# Patient Record
Sex: Female | Born: 1965 | Race: White | Hispanic: No | Marital: Married | Smoking: Former smoker
Health system: Southern US, Community
[De-identification: ages and names within clinical notes are randomized; demographics above are authoritative.]

## PROBLEM LIST (undated history)

## (undated) DIAGNOSIS — R112 Nausea with vomiting, unspecified: Secondary | ICD-10-CM

## (undated) DIAGNOSIS — C50511 Malignant neoplasm of lower-outer quadrant of right female breast: Secondary | ICD-10-CM

## (undated) DIAGNOSIS — Z8679 Personal history of other diseases of the circulatory system: Secondary | ICD-10-CM

## (undated) DIAGNOSIS — I499 Cardiac arrhythmia, unspecified: Secondary | ICD-10-CM

## (undated) DIAGNOSIS — R55 Syncope and collapse: Secondary | ICD-10-CM

## (undated) DIAGNOSIS — C50919 Malignant neoplasm of unspecified site of unspecified female breast: Secondary | ICD-10-CM

## (undated) DIAGNOSIS — Z9889 Other specified postprocedural states: Secondary | ICD-10-CM

## (undated) DIAGNOSIS — K5900 Constipation, unspecified: Secondary | ICD-10-CM

## (undated) HISTORY — DX: Malignant neoplasm of lower-outer quadrant of right female breast: C50.511

## (undated) HISTORY — PX: BREAST BIOPSY: SHX20

## (undated) HISTORY — PX: WISDOM TOOTH EXTRACTION: SHX21

## (undated) HISTORY — DX: Syncope and collapse: R55

---

## 1987-07-29 HISTORY — PX: VAGINAL HYSTERECTOMY: SUR661

## 1988-07-28 HISTORY — PX: APPENDECTOMY: SHX54

## 2012-07-28 DIAGNOSIS — Z8679 Personal history of other diseases of the circulatory system: Secondary | ICD-10-CM

## 2012-07-28 HISTORY — DX: Personal history of other diseases of the circulatory system: Z86.79

## 2013-05-05 ENCOUNTER — Encounter: Payer: Self-pay | Admitting: *Deleted

## 2013-05-05 ENCOUNTER — Ambulatory Visit (INDEPENDENT_AMBULATORY_CARE_PROVIDER_SITE_OTHER): Payer: PRIVATE HEALTH INSURANCE | Admitting: Internal Medicine

## 2013-05-05 ENCOUNTER — Encounter: Payer: Self-pay | Admitting: Internal Medicine

## 2013-05-05 VITALS — BP 110/70 | HR 80 | Ht 65.0 in | Wt 140.0 lb

## 2013-05-05 DIAGNOSIS — R000 Tachycardia, unspecified: Secondary | ICD-10-CM

## 2013-05-05 DIAGNOSIS — R55 Syncope and collapse: Secondary | ICD-10-CM

## 2013-05-05 NOTE — Progress Notes (Signed)
HPI  Patient is a 47 yo who was referred for evaluation of syncope and palpitations  The patient has had about 4 episdoes of syncope over the past four years  Last one was in June   She was working out at [employer] on ellipitica  Had gone about 11 min.  Got dizzy  HR up  Passed out.  When she woke up her HR was in the 180s. Episode prior was in May  She had been moving things in yard  Claims she was staying hydrated.  Passed out.  She has been [place]  Had been walking on treadmill.  No problem  Only elliptical.   Not working out since last event.    Smokes 1ppd    No CP No Known Allergies  Current Outpatient Prescriptions  Medication Sig Dispense Refill  . Biotin 5000 MCG CAPS Take 5,000 mcg by mouth daily.      . Cholecalciferol (VITAMIN D3) 5000 UNIT/ML LIQD Take 0.25 mLs by mouth daily.      . Cyanocobalamin (VITAMIN B-12 PO) Take 2,500 mcg by mouth daily.      . Multiple Vitamins-Minerals (CENTRUM PO) Take 1 tablet by mouth.      . senna-docusate (SENOKOT-S) 8.6-50 MG per tablet Take 1 tablet by mouth daily.      . vitamin C (ASCORBIC ACID) 500 MG tablet Take 500 mg by mouth daily.      . vitamin E 400 UNIT capsule Take 400 Units by mouth daily.       No current facility-administered medications for this visit.    Past Medical History  Diagnosis Date  . Syncope     sudden onset    Past Surgical History  Procedure Laterality Date  . Abdominal hysterectomy  1989  . Appendectomy  1990    Family History  Problem Relation Age of Onset  . Hyperlipidemia Father   . Hypertension Father   . Kidney failure Maternal Grandmother   . Diabetes Maternal Grandfather   . Stroke Maternal Grandfather   . Lung cancer Paternal Grandmother   . Heart attack Paternal Grandfather   . Heart disease Paternal Grandfather     History   Social History  . Marital Status: Married    Spouse Name: N/A    Number of Children: N/A  . Years of Education: N/A   Occupational History  . Not on  file.   Social History Main Topics  . Smoking status: Current Every Day Smoker -- 1.00 packs/day  . Smokeless tobacco: Not on file  . Alcohol Use: Yes  . Drug Use: No  . Sexual Activity: Not on file   Other Topics Concern  . Not on file   Social History Narrative  . No narrative on file    Review of Systems:  All systems reviewed.  They are negative to the above problem except as previously stated.  Vital Signs: BP 114/70  Pulse 74  Ht 5\' 5"  (1.651 m)  Wt 141 lb 12.8 oz (64.32 kg)  BMI 23.6 kg/m2  Physical Exam Patient is in NAD HEENT:  Normocephalic, atraumatic. EOMI, PERRLA.  Neck: JVP is normal.  No bruits.  Lungs: clear to auscultation. No rales no wheezes.  Heart: Regular rate and rhythm. Normal S1, S2. No S3.   No significant murmurs. PMI not displaced.  Abdomen:  Supple, nontender. Normal bowel sounds. No masses. No hepatomegaly.  Extremities:   Good distal pulses throughout. No lower extremity edema.  Musculoskeletal :moving all extremities.  Neuro:   alert and oriented x3.  CN II-XII grossly intact.  EKG  SR 74 bpm  Nonspecific ST T wave changes    Assessment and Plan:  1.  SYncope  She is not othostatic on exam.  I am not convinced that it due to arrhythmia  But she has taken her pulse and it has been fast in past I would recomm an echo  i would also recomm a stress test to watch HR/BP response.  2.  TOb  Counselled on cessaton.

## 2013-05-05 NOTE — Patient Instructions (Signed)
Your physician has requested that you have an exercise tolerance test.the same day as Dr. Tenny Craw is in the office.  For further information please visit [email or web address] Please also follow instruction sheet, as given.  Your physician has requested that you have an echocardiogram. Echocardiography is a painless test that uses sound waves to create images of your heart. It provides your doctor with information about the size and shape of your heart and how well your heart's chambers and valves are working. This procedure takes approximately one hour. There are no restrictions for this procedure.  No changes were made today with your medications

## 2013-06-16 ENCOUNTER — Ambulatory Visit (INDEPENDENT_AMBULATORY_CARE_PROVIDER_SITE_OTHER): Payer: PRIVATE HEALTH INSURANCE | Admitting: Internal Medicine

## 2013-06-16 ENCOUNTER — Ambulatory Visit (HOSPITAL_COMMUNITY): Payer: PRIVATE HEALTH INSURANCE | Attending: Cardiovascular Disease

## 2013-06-16 ENCOUNTER — Encounter: Payer: Self-pay | Admitting: Cardiovascular Disease

## 2013-06-16 ENCOUNTER — Encounter (INDEPENDENT_AMBULATORY_CARE_PROVIDER_SITE_OTHER): Payer: Self-pay

## 2013-06-16 DIAGNOSIS — R000 Tachycardia, unspecified: Secondary | ICD-10-CM

## 2013-06-16 DIAGNOSIS — R55 Syncope and collapse: Secondary | ICD-10-CM

## 2013-06-16 DIAGNOSIS — R002 Palpitations: Secondary | ICD-10-CM | POA: Insufficient documentation

## 2013-06-16 DIAGNOSIS — I079 Rheumatic tricuspid valve disease, unspecified: Secondary | ICD-10-CM | POA: Insufficient documentation

## 2013-06-16 DIAGNOSIS — F17200 Nicotine dependence, unspecified, uncomplicated: Secondary | ICD-10-CM | POA: Insufficient documentation

## 2013-06-16 NOTE — Progress Notes (Signed)
Exercise Treadmill Test  Pre-Exercise Testing Evaluation Rhythm: normal sinus  Rate: 78 bpm     Test  Exercise Tolerance Test Ordering MD: Dietrich Pates, MD  Interpreting MD: Dietrich Pates, MD  Unique Test No: 1  Treadmill:  1  Indication for ETT: syncope  Contraindication to ETT: No   Stress Modality: exercise - treadmill  Cardiac Imaging Performed: non   Protocol: standard Bruce - maximal  Max BP:  153/86  Max MPHR (bpm):  174 85% MPR (bpm):  148  MPHR obtained (bpm):  166 % MPHR obtained:  95%  Reached 85% MPHR (min:sec):   Total Exercise Time (min-sec):  5:46  Workload in METS:  7 Borg Scale:   Reason ETT Terminated:  SOB    ST Segment Analysis At Rest: normal ST segments - no evidence of significant ST depression With Exercise: no evidence of significant ST depression  Other Information Arrhythmia:  No Angina during ETT:  absent (0) Quality of ETT:  diagnostic  ETT Interpretation:  Clinically negative, electrically negative for ischemia.  Note patient got very SOB during study and had to stop  Did not appear to be ischemia.    HR increased quickly during test  Poor exercise tolerance.   Recommendations: Try to increase endurance with walking ,exercise.

## 2013-06-16 NOTE — Progress Notes (Signed)
Echocardiogram performed.  

## 2013-07-15 ENCOUNTER — Telehealth: Payer: Self-pay | Admitting: *Deleted

## 2013-07-15 NOTE — Telephone Encounter (Signed)
Per Dr Clotilde Dieter had stress test 11/20 Very SOB and poor exercise tolerance Stress test though was normal Could she gett into pulm rehab?  Aware of dr Tenny Craw recommendation, she is unable to do at this time due to her work schedule. She will work on exercising at home.

## 2014-10-30 ENCOUNTER — Telehealth: Payer: Self-pay | Admitting: *Deleted

## 2014-10-30 NOTE — Telephone Encounter (Signed)
Left message for a return phone call to schedule for [facility]. Awaiting patient response.  

## 2014-11-01 ENCOUNTER — Encounter: Payer: Self-pay | Admitting: *Deleted

## 2014-11-01 ENCOUNTER — Telehealth: Payer: Self-pay | Admitting: *Deleted

## 2014-11-01 DIAGNOSIS — Z170 Estrogen receptor positive status [ER+]: Secondary | ICD-10-CM

## 2014-11-01 DIAGNOSIS — C50511 Malignant neoplasm of lower-outer quadrant of right female breast: Secondary | ICD-10-CM

## 2014-11-01 HISTORY — DX: Malignant neoplasm of lower-outer quadrant of right female breast: C50.511

## 2014-11-01 NOTE — Telephone Encounter (Signed)
Confirmed [facility] for 11/08/14 at 1200 .  Instructions and contact information given.

## 2014-11-08 ENCOUNTER — Other Ambulatory Visit: Payer: Self-pay | Admitting: *Deleted

## 2014-11-08 ENCOUNTER — Ambulatory Visit: Payer: PRIVATE HEALTH INSURANCE | Admitting: Physical Therapy

## 2014-11-08 ENCOUNTER — Other Ambulatory Visit (HOSPITAL_BASED_OUTPATIENT_CLINIC_OR_DEPARTMENT_OTHER): Payer: BLUE CROSS/BLUE SHIELD

## 2014-11-08 ENCOUNTER — Encounter: Payer: Self-pay | Admitting: Oncology

## 2014-11-08 ENCOUNTER — Ambulatory Visit
Admission: RE | Admit: 2014-11-08 | Discharge: 2014-11-08 | Disposition: A | Discharge status: Home / Self Care | Payer: PRIVATE HEALTH INSURANCE | Source: Ambulatory Visit | Attending: Radiation Oncology | Admitting: Radiation Oncology

## 2014-11-08 ENCOUNTER — Ambulatory Visit: Payer: BLUE CROSS/BLUE SHIELD

## 2014-11-08 ENCOUNTER — Encounter (INDEPENDENT_AMBULATORY_CARE_PROVIDER_SITE_OTHER): Payer: Self-pay

## 2014-11-08 ENCOUNTER — Ambulatory Visit (HOSPITAL_BASED_OUTPATIENT_CLINIC_OR_DEPARTMENT_OTHER): Payer: BLUE CROSS/BLUE SHIELD | Admitting: Oncology

## 2014-11-08 ENCOUNTER — Other Ambulatory Visit: Payer: PRIVATE HEALTH INSURANCE | Admitting: Oncology

## 2014-11-08 VITALS — BP 123/75 | HR 79 | Temp 98.3°F | Resp 18 | Ht 65.0 in | Wt 118.2 lb

## 2014-11-08 DIAGNOSIS — C50511 Malignant neoplasm of lower-outer quadrant of right female breast: Secondary | ICD-10-CM

## 2014-11-08 DIAGNOSIS — D0511 Intraductal carcinoma in situ of right breast: Secondary | ICD-10-CM

## 2014-11-08 DIAGNOSIS — R002 Palpitations: Secondary | ICD-10-CM | POA: Diagnosis not present

## 2014-11-08 DIAGNOSIS — Z720 Tobacco use: Secondary | ICD-10-CM

## 2014-11-08 LAB — CBC WITH DIFFERENTIAL/PLATELET
BASO%: 1.2 % (ref 0.0–2.0)
Basophils Absolute: 0.1 10*3/uL (ref 0.0–0.1)
EOS%: 3.4 % (ref 0.0–7.0)
Eosinophils Absolute: 0.2 10*3/uL (ref 0.0–0.5)
HCT: 39.9 % (ref 34.8–46.6)
HGB: 12.9 g/dL (ref 11.6–15.9)
LYMPH%: 36.2 % (ref 14.0–49.7)
MCH: 29.7 pg (ref 25.1–34.0)
MCHC: 32.2 g/dL (ref 31.5–36.0)
MCV: 91.9 fL (ref 79.5–101.0)
MONO#: 0.9 10*3/uL (ref 0.1–0.9)
MONO%: 12.7 % (ref 0.0–14.0)
NEUT#: 3.3 10*3/uL (ref 1.5–6.5)
NEUT%: 46.5 % (ref 38.4–76.8)
Platelets: 386 10*3/uL (ref 145–400)
RBC: 4.34 10*6/uL (ref 3.70–5.45)
RDW: 13.4 % (ref 11.2–14.5)
WBC: 7 10*3/uL (ref 3.9–10.3)
lymph#: 2.6 10*3/uL (ref 0.9–3.3)

## 2014-11-08 LAB — COMPREHENSIVE METABOLIC PANEL (CC13)
ALT: 10 U/L (ref 0–55)
AST: 13 U/L (ref 5–34)
Albumin: 4.2 g/dL (ref 3.5–5.0)
Alkaline Phosphatase: 61 U/L (ref 40–150)
Anion Gap: 10 mEq/L (ref 3–11)
BUN: 11.8 mg/dL (ref 7.0–26.0)
CHLORIDE: 108 meq/L (ref 98–109)
CO2: 25 meq/L (ref 22–29)
Calcium: 9.3 mg/dL (ref 8.4–10.4)
Creatinine: 0.9 mg/dL (ref 0.6–1.1)
EGFR: 81 mL/min/{1.73_m2} — ABNORMAL LOW (ref >90)
Glucose: 87 mg/dl (ref 70–140)
POTASSIUM: 4.5 meq/L (ref 3.5–5.1)
SODIUM: 144 meq/L (ref 136–145)
TOTAL PROTEIN: 7.1 g/dL (ref 6.4–8.3)
Total Bilirubin: 0.82 mg/dL (ref 0.20–1.20)

## 2014-11-08 MED ORDER — VARENICLINE TARTRATE 0.5 MG PO TABS: 0.5000 mg | ORAL_TABLET | Freq: Two times a day (BID) | ORAL | Status: DC

## 2014-11-08 NOTE — Progress Notes (Signed)
Radiation Oncology         [phone]) [phone] ________________________________  Initial outpatient Consultation  Name: Tricia Thomas MRN: [id number]  Date: 11/08/2014  DOB: 05-Jan-1966  CZ:YSAYT,KZSWFUX Weyman Croon, MD  Stark Klein, MD   REFERRING PHYSICIAN: Stark Klein, MD  DIAGNOSIS: Stage 0 Right Breast LOQ DCIS, ER+ / PR+, Low Grade    ICD-9-CM ICD-10-CM   1. Breast cancer of lower-outer quadrant of right female breast 174.5 C50.511     HISTORY OF PRESENT ILLNESS::Tricia Thomas is a 49 y.o. female who presented with micro calcifications in the lateral right breast, 2.2cm on mammography.  Stereotactic biopsy revealed low grade DCIS that is ER / PR +.  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 She smokes cigarettes  PREVIOUS RADIATION THERAPY: No  PAST MEDICAL HISTORY:  has a past medical history of Syncope and Breast cancer of lower-outer quadrant of right female breast (11/01/2014).    PAST SURGICAL HISTORY: Past Surgical History  Procedure Laterality Date  . Abdominal hysterectomy  1989  . Appendectomy  1990  . Wisdom tooth extraction      FAMILY HISTORY: family history includes Diabetes in her maternal grandfather; Heart attack in her paternal grandfather; Heart disease in her paternal grandfather; Hyperlipidemia in her father; Hypertension in her father; Kidney failure in her maternal grandmother; Lung cancer in her paternal grandmother; Stroke in her maternal grandfather.  SOCIAL HISTORY:  reports that she has been smoking.  She does not have any smokeless tobacco history on file. She reports that she drinks alcohol. She reports that she does not use illicit drugs.  ALLERGIES: Review of patient's allergies indicates no known allergies.  MEDICATIONS:  No current outpatient prescriptions on file.   No current facility-administered medications for this encounter.    REVIEW OF SYSTEMS:  Notable for that above.   PHYSICAL EXAM:   Vitals with Age-Percentiles  11/08/2014  Length 323.5 cm  Systolic 573  Diastolic 78  Pulse 79  Respiration 18  Weight 53.615 kg  BMI 19.7  VISIT REPORT    General: Alert and oriented, in no acute distress HEENT: Head is normocephalic. Extraocular movements are intact. Oropharynx is clear. Neck: Neck is supple, no palpable cervical or supraclavicular lymphadenopathy. Heart: Regular in rate and rhythm with no murmurs, rubs, or gallops. Chest: Clear to auscultation bilaterally, with no rhonchi, wheezes, or rales. Abdomen: Soft, nontender, nondistended, with no rigidity or guarding. Extremities: No cyanosis or edema. Lymphatics: see Neck Exam Skin: No concerning lesions. Musculoskeletal: symmetric strength and muscle tone throughout. Neurologic: Cranial nerves II through XII are grossly intact. No obvious focalities. Speech is fluent. Coordination is intact. Psychiatric: Judgment and insight are intact. Affect is appropriate. Breasts: right breast LOQ biopsy changes; no axillary adenopathy bilaterally nor left breast lumps  ECOG = 0  0 - Asymptomatic (Fully active, able to carry on all predisease activities without restriction)  1 - Symptomatic but completely ambulatory (Restricted in physically strenuous activity but ambulatory and able to carry out work of a light or sedentary nature. For example, light housework, office work)  2 - Symptomatic, <50% in bed during the day (Ambulatory and capable of all self care but unable to carry out any work activities. Up and about more than 50% of waking hours)  3 - Symptomatic, >50% in bed, but not bedbound (Capable of only limited self-care, confined to bed or chair 50% or more of waking hours)  4 - Bedbound (Completely disabled. Cannot carry on any self-care.  Totally confined to bed or chair)  5 - Death   Eustace Pen MM, Creech RH, Tormey DC, et al. [phone]). "Toxicity and response criteria of the [facility]". [place]ike Oncol. 5 (6):  [phone]   LABORATORY DATA:  Lab Results  Component Value Date   WBC 7.0 11/08/2014   HGB 12.9 11/08/2014   HCT 39.9 11/08/2014   MCV 91.9 11/08/2014   PLT 386 11/08/2014   CMP     Component Value Date/Time   NA 144 11/08/2014 1220   K 4.5 11/08/2014 1220   CO2 25 11/08/2014 1220   GLUCOSE 87 11/08/2014 1220   BUN 11.8 11/08/2014 1220   CREATININE 0.9 11/08/2014 1220   CALCIUM 9.3 11/08/2014 1220   PROT 7.1 11/08/2014 1220   ALBUMIN 4.2 11/08/2014 1220   AST 13 11/08/2014 1220   ALT 10 11/08/2014 1220   ALKPHOS 61 11/08/2014 1220   BILITOT 0.82 11/08/2014 1220         RADIOGRAPHY: as above    IMPRESSION/PLAN: She has been discussed at our multidisciplinary tumor board.  The consensus is that she would be a good candidate for breast conservation. I talked to her about the option of a mastectomy and informed her that her expected overall survival would be equivalent between mastectomy and breast conservation, based upon randomized controlled data.    It was a pleasure meeting the patient today. We discussed the risks, benefits, and side effects of radiotherapy in the setting of breast conservation. We discussed that radiation would take approximately 4-6 weeks to complete and that I would give the patient a few weeks to heal following surgery before starting treatment planning. We spoke about acute effects including skin irritation and fatigue as well as much less common late effects including lung irritation. We spoke about the latest technology that is used to minimize the risk of late effects for breast cancer patients undergoing radiotherapy. No guarantees of treatment were given. The patient is enthusiastic about proceeding with treatment. I look forward to participating in the patient's care.    The patient continues to use tobacco. The patient was counseled to stop using tobacco and was offered pharmacotherapy to help with this. The patient declined pharmacotherapy in the  form of nicotine patches but may talk to her PCP about Chantix. __________________________________________   Eppie Gibson, MD

## 2014-11-08 NOTE — Progress Notes (Signed)
Checked in new pt with no financial concerns prior to seeing the dr. Informed pt if chemo is part of her treatment we will call her ins to see if Josem Kaufmann is req and will obtain it if it is as well as contact foundations that offer copay assistance for chemo if needed. She has my card for any billing questions or concerns.

## 2014-11-08 NOTE — Progress Notes (Signed)
[facility]  [phone] [phone]     ID: Tricia Thomas DOB: [birth year]  MR#: [id number]  CVE#:[id number]  Patient Care Team: Fay Records, MD as PCP - General (Cardiology) Stark Klein, MD as Consulting Physician (General Surgery) Chauncey Cruel, MD as Consulting Physician (Oncology) Eppie Gibson, MD as Attending Physician (Radiation Oncology) Mauro Kaufmann, RN as Registered Nurse Rockwell Germany, RN as Registered Nurse PCP: Dorris Carnes, MD GYN: OTHER MD:  CHIEF COMPLAINT: Ductal carcinoma in situ  CURRENT TREATMENT: Awaiting definitive surgery   BREAST CANCER HISTORY: Tricia Thomas had her first ever mammography at [facility] 09/03/2013. The breast composition was category C. Heterogeneous calcifications were noted in the left breast and she was recalled for additional views 09/08/2013. Grouped and scattered round calcifications in the left breast posteriorly were felt to be most likely benign, but 6 month follow-up was recommended. This was performed 03/20/2014. This time there was no mammographic evidence of malignancy.  On 10/24/2014 however when the patient had her annual follow-up mammography, in addition to the benign appearing calcifications previously noted, there was a 2.2 cm area of calcifications in the right breast now, upper outer quadrant, which appeared to have increased in number. Biopsy of this area 10/26/2014 showed ([id number]) ductal carcinoma in situ, grade 1, estrogen receptor 100% positive and progesterone receptor 100% positive, both with strong staining intensity.  Her subsequent history is as detailed below   INTERVAL HISTORY: Tricia Thomas was evaluated in the multidisciplinary breast cancer clinic 11/08/2014 accompanied by her husband Evelena Peat. Her case was presented that same day at the multidisciplinary breast cancer conference. At that time a preliminary plan was proposed for breast conserving surgery with no sentinel lymph node  sampling, followed by radiation and then hormones. Genetics were also recommended.  REVIEW OF SYSTEMS: There were no specific symptoms leading to the original mammogram, which was routinely scheduled. The patient denies unusual headaches, visual changes, nausea, vomiting, stiff neck, dizziness, or gait imbalance. There has been no cough, phlegm production, or pleurisy, no chest pain or pressure, and no change in bowel or bladder habits. She does have palpitation sometimes. These have been evaluated by cardiology. She has been advised to avoid caffeine. The patient denies fever, rash, bleeding, unexplained fatigue or unexplained weight loss. She admits to some seasonal sinus problems involving a little bit of cough, worries about forgetfulness and her weight, but denies anxiety or depression. A detailed review of systems was otherwise entirely negative.  PAST MEDICAL HISTORY: Past Medical History  Diagnosis Date  . Syncope     sudden onset  . Breast cancer of lower-outer quadrant of right female breast 11/01/2014    PAST SURGICAL HISTORY: Past Surgical History  Procedure Laterality Date  . Abdominal hysterectomy  1989  . Appendectomy  1990  . Wisdom tooth extraction      FAMILY HISTORY Family History  Problem Relation Age of Onset  . Hyperlipidemia Father   . Hypertension Father   . Kidney failure Maternal Grandmother   . Diabetes Maternal Grandfather   . Stroke Maternal Grandfather   . Lung cancer Paternal Grandmother   . Heart attack Paternal Grandfather   . Heart disease Paternal Grandfather    the patient's parents are still living as of April 2016. The patient has one brother and one sister. There is no cancer in the immediate family. The paternal grandmother died of a "vascular cancer" at age 36. There is no history of breast or ovarian cancer in  the family that the patient is aware of  GYNECOLOGIC HISTORY:  No LMP recorded. Menarche age 49, first live birth age 49. She is GX  P2. She underwent a hysterectomy without salpingo-oophorectomy in 1989. She did not take hormone replacement.  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 Her (second) husband, Jerri Glauser is Clinical cytogeneticist store in [place] with the patient lives. Prevacid she works in [place]). The patient's children are [place] Lives in [place] and Works As a Marine scientist and [employer] Who Lives in [place] and Is a Dealer. The Patient Has 2 Grandchildren. She Is Not a Ambulance person.    ADVANCED DIRECTIVES: Not in place   HEALTH MAINTENANCE: History  Substance Use Topics  . Smoking status: Current Every Day Smoker -- 1.00 packs/day  . Smokeless tobacco: Not on file  . Alcohol Use: Yes     Colonoscopy:  PAP:  Bone density:  Lipid panel:  No Known Allergies  No current outpatient prescriptions on file.   No current facility-administered medications for this visit.    OBJECTIVE: Middle-aged white woman without appears younger than stated age 49 Vitals:   11/08/14 1344  BP: 123/75  Pulse:   Temp:   Resp:      Body mass index is 19.67 kg/(m^2).    ECOG FS:0 - Asymptomatic  Ocular: Sclerae unicteric, pupils equal, round and reactive to light Ear-nose-throat: Oropharynx clear, upper and lower braces in place Lymphatic: No cervical or supraclavicular adenopathy Lungs no rales or rhonchi, good excursion bilaterally Heart regular rate and rhythm, no murmur appreciated Abd soft, nontender, positive bowel sounds MSK no focal spinal tenderness, no joint edema Neuro: non-focal, well-oriented, appropriate affect Breasts: The right breast is status post recent biopsy. I do not palpate a mass. There are no skin or nipple changes of concern. The right axilla is benign. The left breast is unremarkable   LAB RESULTS:  CMP     Component Value Date/Time   NA 144 11/08/2014 1220   K 4.5 11/08/2014 1220   CO2 25 11/08/2014  1220   GLUCOSE 87 11/08/2014 1220   BUN 11.8 11/08/2014 1220   CREATININE 0.9 11/08/2014 1220   CALCIUM 9.3 11/08/2014 1220   PROT 7.1 11/08/2014 1220   ALBUMIN 4.2 11/08/2014 1220   AST 13 11/08/2014 1220   ALT 10 11/08/2014 1220   ALKPHOS 61 11/08/2014 1220   BILITOT 0.82 11/08/2014 1220    INo results found for: SPEP, UPEP  Lab Results  Component Value Date   WBC 7.0 11/08/2014   NEUTROABS 3.3 11/08/2014   HGB 12.9 11/08/2014   HCT 39.9 11/08/2014   MCV 91.9 11/08/2014   PLT 386 11/08/2014      Chemistry      Component Value Date/Time   NA 144 11/08/2014 1220   K 4.5 11/08/2014 1220   CO2 25 11/08/2014 1220   BUN 11.8 11/08/2014 1220   CREATININE 0.9 11/08/2014 1220      Component Value Date/Time   CALCIUM 9.3 11/08/2014 1220   ALKPHOS 61 11/08/2014 1220   AST 13 11/08/2014 1220   ALT 10 11/08/2014 1220   BILITOT 0.82 11/08/2014 1220       No results found for: LABCA2  No components found for: LABCA125  No results for input(s): INR in the last 168 hours.  Urinalysis No results found for: COLORURINE, APPEARANCEUR, [place], [place], [place], [place], [place], [place], [place], [place], NITRITE, LEUKOCYTESUR  STUDIES: No results found.  ASSESSMENT: 49 y.o. [place] woman status post right breast biopsy 10/26/2014 for ductal carcinoma in situ, grade 1, strongly estrogen and progesterone receptor positive  (1) the patient is leaning towards mastectomy with reconstruction but has not yet decided on a definite course  (2) if she decides on lumpectomy she will need adjuvant radiation  (3) when she completes her local treatment she can consider anti-estrogens for breast cancer risk reduction  (4) Continuing tobacco abuse: The patient has been strongly advised to quit. She has requested Chantix, which she took before without complications. This will be prescribed for her.  PLAN: We spent the better part of today's hour-long  appointment discussing the biology of breast cancer in general, and the specifics of the patient's tumor in particular. Patchy understands noninvasive breast cancer in itself is not a life-threatening, since the cancer cells are trapped in the ducts were they started and they cannot travel to a vital organ. Our general recommendation is for lumpectomy with no sentinel lymph node sampling. This would be followed by radiation. If she took this course, her risk of local recurrence would be quite low and anti-estrogens would be of marginal benefit.   However she is attracted to the idea of mastectomy, since this "takes care of the problem", and allows her to avoid radiation. She could still take anti-estrogens for prophylaxis (risk of developing a cancer in the other breast would be approximately 1/2% per year).  Encourage her to look at photos of reconstructed breasts and to meet with plastic surgery before making a definite decision. This is being operationalized. I also urged her strongly to quit smoking for at least 2 months so she can heal properly from her surgery. She requested Chantix. She tells me that allowed her to quit smoking between January and December 2015 and that she had no problems from that medication. I have placed that prescription.  Finally she would prefer to be followed long-term by an oncologist in [place], since that is where she works. I suggested a couple of days but she tells me she already has a name in mind. However she would like to return to see me first and start an anti-estrogens before going to a Investment banker, corporate. I have made her a return appointment with me in approximately 2 months.  Tricia Thomas has a good understanding of the overall plan. She agrees with it. She knows the goal of treatment in her case is cure. She will call with any problems that may develop before her next visit here.  Chauncey Cruel, MD   11/08/2014 5:58 PM [facility] [address] [place], [place] [address] Tel. [phone]    Fax. [phone]

## 2014-11-09 ENCOUNTER — Telehealth: Payer: Self-pay | Admitting: Oncology

## 2014-11-09 ENCOUNTER — Other Ambulatory Visit: Payer: Self-pay | Admitting: *Deleted

## 2014-11-09 NOTE — Telephone Encounter (Signed)
per pof to sch pt appt-cld & gave pt appt time & date

## 2014-11-10 ENCOUNTER — Ambulatory Visit
Admission: RE | Admit: 2014-11-10 | Discharge: 2014-11-10 | Disposition: A | Discharge status: Home / Self Care | Payer: BLUE CROSS/BLUE SHIELD | Source: Ambulatory Visit | Attending: Oncology | Admitting: Oncology

## 2014-11-10 DIAGNOSIS — C50511 Malignant neoplasm of lower-outer quadrant of right female breast: Secondary | ICD-10-CM

## 2014-11-10 MED ORDER — GADOBENATE DIMEGLUMINE 529 MG/ML IV SOLN: 9.0000 mL | Freq: Once | INTRAVENOUS | Status: AC | PRN

## 2014-11-13 ENCOUNTER — Telehealth: Payer: Self-pay | Admitting: General Surgery

## 2014-11-13 ENCOUNTER — Other Ambulatory Visit: Payer: Self-pay

## 2014-11-13 ENCOUNTER — Other Ambulatory Visit: Payer: Self-pay | Admitting: General Surgery

## 2014-11-13 DIAGNOSIS — C50911 Malignant neoplasm of unspecified site of right female breast: Secondary | ICD-10-CM

## 2014-11-13 NOTE — Addendum Note (Signed)
Addended by: Stark Klein on: 11/13/2014 02:34 PM   Modules accepted: Orders

## 2014-11-14 ENCOUNTER — Telehealth: Payer: Self-pay | Admitting: *Deleted

## 2014-11-14 NOTE — Telephone Encounter (Signed)
I spoke to patient regarding her MRI.  She would like lumpectomy if possible.  I discussed this with Dr. Joanell Rising at [facility] radiology.  He will arrange for US guided possible LN bx on right and possible left sided biopsy.  If unable to see these lesions, we will arrange for MR guided biopsies.  I will see the patient after biopsies.  Pt in agreement with plan.

## 2014-11-14 NOTE — Telephone Encounter (Signed)
Left message for a return phone call from [facility] 11/08/14.  Awaiting patient response.

## 2014-11-16 ENCOUNTER — Other Ambulatory Visit: Payer: Self-pay | Admitting: Radiology

## 2014-11-20 ENCOUNTER — Other Ambulatory Visit: Payer: Self-pay | Admitting: Oncology

## 2014-11-21 ENCOUNTER — Other Ambulatory Visit: Payer: Self-pay

## 2014-11-21 ENCOUNTER — Other Ambulatory Visit: Payer: Self-pay | Admitting: General Surgery

## 2014-11-21 DIAGNOSIS — C50911 Malignant neoplasm of unspecified site of right female breast: Secondary | ICD-10-CM

## 2014-11-21 NOTE — Addendum Note (Signed)
Addended by: Stark Klein on: 11/21/2014 04:52 PM   Modules accepted: Orders

## 2014-11-22 ENCOUNTER — Other Ambulatory Visit: Payer: Self-pay | Admitting: General Surgery

## 2014-11-22 DIAGNOSIS — R928 Other abnormal and inconclusive findings on diagnostic imaging of breast: Secondary | ICD-10-CM

## 2014-11-23 ENCOUNTER — Other Ambulatory Visit: Payer: Self-pay | Admitting: General Surgery

## 2014-11-23 DIAGNOSIS — R928 Other abnormal and inconclusive findings on diagnostic imaging of breast: Secondary | ICD-10-CM

## 2014-11-24 ENCOUNTER — Other Ambulatory Visit: Payer: Self-pay | Admitting: General Surgery

## 2014-11-24 ENCOUNTER — Ambulatory Visit
Admission: RE | Admit: 2014-11-24 | Discharge: 2014-11-24 | Disposition: A | Discharge status: Home / Self Care | Payer: BLUE CROSS/BLUE SHIELD | Source: Ambulatory Visit | Attending: General Surgery | Admitting: General Surgery

## 2014-11-24 DIAGNOSIS — R928 Other abnormal and inconclusive findings on diagnostic imaging of breast: Secondary | ICD-10-CM

## 2014-11-24 MED ORDER — GADOBENATE DIMEGLUMINE 529 MG/ML IV SOLN: 10.0000 mL | Freq: Once | INTRAVENOUS | Status: AC | PRN

## 2014-11-27 ENCOUNTER — Ambulatory Visit
Admission: RE | Admit: 2014-11-27 | Discharge: 2014-11-27 | Disposition: A | Discharge status: Home / Self Care | Payer: BLUE CROSS/BLUE SHIELD | Source: Ambulatory Visit | Attending: General Surgery | Admitting: General Surgery

## 2014-11-27 DIAGNOSIS — R928 Other abnormal and inconclusive findings on diagnostic imaging of breast: Secondary | ICD-10-CM

## 2014-11-27 MED ORDER — GADOBENATE DIMEGLUMINE 529 MG/ML IV SOLN
9.0000 mL | Freq: Once | INTRAVENOUS | Status: AC | PRN
  Administered 2014-11-27: 9 mL via INTRAVENOUS

## 2014-12-08 ENCOUNTER — Other Ambulatory Visit: Payer: Self-pay | Admitting: General Surgery

## 2014-12-08 DIAGNOSIS — C50911 Malignant neoplasm of unspecified site of right female breast: Secondary | ICD-10-CM

## 2014-12-11 ENCOUNTER — Encounter: Payer: Self-pay | Admitting: Oncology

## 2014-12-15 ENCOUNTER — Telehealth: Payer: Self-pay | Admitting: Oncology

## 2014-12-15 ENCOUNTER — Encounter: Payer: Self-pay | Admitting: *Deleted

## 2014-12-15 NOTE — Telephone Encounter (Signed)
Left message to confirm appointment change from 06/06 to 07/25 per pof. Mailed calendar.

## 2015-01-01 ENCOUNTER — Ambulatory Visit: Payer: BLUE CROSS/BLUE SHIELD | Admitting: Oncology

## 2015-01-30 ENCOUNTER — Other Ambulatory Visit (HOSPITAL_COMMUNITY): Payer: Self-pay | Admitting: Plastic Surgery

## 2015-01-31 ENCOUNTER — Encounter (HOSPITAL_COMMUNITY): Payer: Self-pay

## 2015-01-31 ENCOUNTER — Encounter (HOSPITAL_COMMUNITY)
Admission: RE | Admit: 2015-01-31 | Discharge: 2015-01-31 | Disposition: A | Discharge status: Home / Self Care | Payer: BLUE CROSS/BLUE SHIELD | Source: Ambulatory Visit | Attending: General Surgery | Admitting: General Surgery

## 2015-01-31 DIAGNOSIS — C50919 Malignant neoplasm of unspecified site of unspecified female breast: Secondary | ICD-10-CM | POA: Insufficient documentation

## 2015-01-31 DIAGNOSIS — Z01812 Encounter for preprocedural laboratory examination: Secondary | ICD-10-CM | POA: Diagnosis present

## 2015-01-31 HISTORY — DX: Personal history of other diseases of the circulatory system: Z86.79

## 2015-01-31 HISTORY — DX: Constipation, unspecified: K59.00

## 2015-01-31 HISTORY — DX: Cardiac arrhythmia, unspecified: I49.9

## 2015-01-31 LAB — CBC
HCT: 36.9 % (ref 36.0–46.0)
HEMOGLOBIN: 12.4 g/dL (ref 12.0–15.0)
MCH: 30.6 pg (ref 26.0–34.0)
MCHC: 33.6 g/dL (ref 30.0–36.0)
MCV: 91.1 fL (ref 78.0–100.0)
Platelets: 264 10*3/uL (ref 150–400)
RBC: 4.05 MIL/uL (ref 3.87–5.11)
RDW: 12.9 % (ref 11.5–15.5)
WBC: 7.5 10*3/uL (ref 4.0–10.5)

## 2015-01-31 NOTE — Pre-Procedure Instructions (Signed)
    Tricia Thomas  01/31/2015       Your procedure is scheduled on Tuesday, July 12.  Report to [facility] Admitting at 9:30 A.M.  Call this number if you have problems the morning of surgery:  [phone]   Remember:  Do not eat food or drink liquids after midnight. Monday night  Take these medicines the morning of surgery with A SIP OF WATER: none   Do not wear jewelry, make-up or nail polish.  Do not wear lotions, powders, or perfumes.     Do not shave 48 hours prior to surgery.     Do not bring valuables to the hospital.  [facility] is not responsible for any belongings or valuables.  Contacts, dentures or bridgework may not be worn into surgery.  Leave your suitcase in the car.  After surgery it may be brought to your room.  For patients admitted to the hospital, discharge time will be determined by your treatment team.    Special instructions:  Preparing for surgery; instructions for CHG showers  Please read over the following fact sheets that you were given. Pain Booklet, Coughing and Deep Breathing and Surgical Site Infection Prevention

## 2015-02-05 MED ORDER — HEPARIN SODIUM (PORCINE) 5000 UNIT/ML IJ SOLN
5000.0000 [IU] | INTRAMUSCULAR | Status: AC
  Administered 2015-02-06: 5000 [IU] via SUBCUTANEOUS
  Filled 2015-02-05: qty 1

## 2015-02-05 MED ORDER — CEFAZOLIN SODIUM-DEXTROSE 2-3 GM-% IV SOLR
2.0000 g | INTRAVENOUS | Status: AC
  Administered 2015-02-06 (×2): 2 g via INTRAVENOUS
  Filled 2015-02-05: qty 50

## 2015-02-06 ENCOUNTER — Inpatient Hospital Stay (HOSPITAL_COMMUNITY)
Admission: RE | Admit: 2015-02-06 | Discharge: 2015-02-07 | DRG: 581 | Disposition: A | Discharge status: Home / Self Care | Payer: BLUE CROSS/BLUE SHIELD | Source: Ambulatory Visit | Attending: Plastic Surgery | Admitting: Plastic Surgery

## 2015-02-06 ENCOUNTER — Inpatient Hospital Stay (HOSPITAL_COMMUNITY): Payer: BLUE CROSS/BLUE SHIELD | Admitting: Anesthesiology

## 2015-02-06 ENCOUNTER — Ambulatory Visit (HOSPITAL_COMMUNITY)
Admission: RE | Admit: 2015-02-06 | Discharge: 2015-02-06 | Disposition: A | Discharge status: Home / Self Care | Payer: BLUE CROSS/BLUE SHIELD | Source: Ambulatory Visit | Attending: General Surgery | Admitting: General Surgery

## 2015-02-06 ENCOUNTER — Encounter (HOSPITAL_COMMUNITY): Payer: Self-pay | Admitting: General Surgery

## 2015-02-06 ENCOUNTER — Encounter (HOSPITAL_COMMUNITY)
Admission: RE | Disposition: A | Discharge status: Home / Self Care | Payer: Self-pay | Source: Ambulatory Visit | Attending: Plastic Surgery

## 2015-02-06 DIAGNOSIS — F17210 Nicotine dependence, cigarettes, uncomplicated: Secondary | ICD-10-CM | POA: Diagnosis present

## 2015-02-06 DIAGNOSIS — C50411 Malignant neoplasm of upper-outer quadrant of right female breast: Principal | ICD-10-CM | POA: Diagnosis present

## 2015-02-06 DIAGNOSIS — C50911 Malignant neoplasm of unspecified site of right female breast: Secondary | ICD-10-CM

## 2015-02-06 DIAGNOSIS — C50919 Malignant neoplasm of unspecified site of unspecified female breast: Secondary | ICD-10-CM | POA: Diagnosis present

## 2015-02-06 HISTORY — PX: NIPPLE SPARING MASTECTOMY/SENTINAL LYMPH NODE BIOPSY/RECONSTRUCTION/PLACEMENT OF TISSUE EXPANDER: SHX6484

## 2015-02-06 HISTORY — PX: RECONSTRUCTION BREAST IMMEDIATE / DELAYED W/ TISSUE EXPANDER: SUR1077

## 2015-02-06 HISTORY — PX: BREAST RECONSTRUCTION WITH PLACEMENT OF TISSUE EXPANDER AND FLEX HD (ACELLULAR HYDRATED DERMIS): SHX6295

## 2015-02-06 HISTORY — PX: MASTECTOMY COMPLETE / SIMPLE W/ SENTINEL NODE BIOPSY: SUR846

## 2015-02-06 SURGERY — NIPPLE SPARING MASTECTOMY WITH SENTINAL LYMPH NODE BIOPSY AND  RECONSTRUCTION WITH PLACEMENT OF TISSUE EXPANDER
Anesthesia: General | Site: Breast | Laterality: Right

## 2015-02-06 MED ORDER — ONDANSETRON HCL 4 MG/2ML IJ SOLN: 4.0000 mg | Freq: Once | INTRAMUSCULAR | Status: DC | PRN

## 2015-02-06 MED ORDER — DOCUSATE SODIUM 100 MG PO CAPS: 100.0000 mg | ORAL_CAPSULE | Freq: Every day | ORAL | Status: DC

## 2015-02-06 MED ORDER — DEXAMETHASONE SODIUM PHOSPHATE 4 MG/ML IJ SOLN
INTRAMUSCULAR | Status: AC
  Filled 2015-02-06: qty 1

## 2015-02-06 MED ORDER — LACTATED RINGERS IV SOLN
INTRAVENOUS | Status: DC
  Administered 2015-02-06: 10:00:00 via INTRAVENOUS

## 2015-02-06 MED ORDER — PNEUMOCOCCAL VAC POLYVALENT 25 MCG/0.5ML IJ INJ
0.5000 mL | INJECTION | INTRAMUSCULAR | Status: AC
  Administered 2015-02-07: 0.5 mL via INTRAMUSCULAR
  Filled 2015-02-06: qty 0.5

## 2015-02-06 MED ORDER — OXYCODONE HCL 5 MG PO TABS: 5.0000 mg | ORAL_TABLET | Freq: Once | ORAL | Status: DC | PRN

## 2015-02-06 MED ORDER — PHENYLEPHRINE HCL 10 MG/ML IJ SOLN
INTRAMUSCULAR | Status: DC | PRN
  Administered 2015-02-06: 80 ug via INTRAVENOUS
  Administered 2015-02-06: 40 ug via INTRAVENOUS
  Administered 2015-02-06: 80 ug via INTRAVENOUS
  Administered 2015-02-06: 40 ug via INTRAVENOUS

## 2015-02-06 MED ORDER — PHENYLEPHRINE 40 MCG/ML (10ML) SYRINGE FOR IV PUSH (FOR BLOOD PRESSURE SUPPORT)
PREFILLED_SYRINGE | INTRAVENOUS | Status: AC
  Filled 2015-02-06: qty 10

## 2015-02-06 MED ORDER — ONDANSETRON HCL 4 MG/2ML IJ SOLN
INTRAMUSCULAR | Status: AC
  Filled 2015-02-06: qty 2

## 2015-02-06 MED ORDER — CEFAZOLIN SODIUM 1-5 GM-% IV SOLN
1.0000 g | Freq: Four times a day (QID) | INTRAVENOUS | Status: DC
  Administered 2015-02-06 – 2015-02-07 (×3): 1 g via INTRAVENOUS
  Filled 2015-02-06 (×4): qty 50

## 2015-02-06 MED ORDER — ROCURONIUM BROMIDE 100 MG/10ML IV SOLN
INTRAVENOUS | Status: DC | PRN
  Administered 2015-02-06: 50 mg via INTRAVENOUS
  Administered 2015-02-06: 10 mg via INTRAVENOUS
  Administered 2015-02-06: 40 mg via INTRAVENOUS

## 2015-02-06 MED ORDER — DEXAMETHASONE SODIUM PHOSPHATE 4 MG/ML IJ SOLN
4.0000 mg | Freq: Once | INTRAMUSCULAR | Status: AC
  Administered 2015-02-06: 4 mg via INTRAVENOUS

## 2015-02-06 MED ORDER — HEPARIN SODIUM (PORCINE) 5000 UNIT/ML IJ SOLN
5000.0000 [IU] | Freq: Three times a day (TID) | INTRAMUSCULAR | Status: DC
  Administered 2015-02-07: 5000 [IU] via SUBCUTANEOUS
  Filled 2015-02-06: qty 1

## 2015-02-06 MED ORDER — FENTANYL CITRATE (PF) 100 MCG/2ML IJ SOLN
INTRAMUSCULAR | Status: AC
  Filled 2015-02-06: qty 2

## 2015-02-06 MED ORDER — FENTANYL CITRATE (PF) 100 MCG/2ML IJ SOLN
100.0000 ug | INTRAMUSCULAR | Status: DC | PRN
  Administered 2015-02-06: 100 ug via INTRAVENOUS

## 2015-02-06 MED ORDER — LACTATED RINGERS IV SOLN
INTRAVENOUS | Status: DC | PRN
  Administered 2015-02-06 (×2): via INTRAVENOUS

## 2015-02-06 MED ORDER — DEXAMETHASONE SODIUM PHOSPHATE 4 MG/ML IJ SOLN
INTRAMUSCULAR | Status: DC | PRN
  Administered 2015-02-06: 8 mg via INTRAVENOUS

## 2015-02-06 MED ORDER — FENTANYL CITRATE (PF) 250 MCG/5ML IJ SOLN
INTRAMUSCULAR | Status: AC
  Filled 2015-02-06: qty 5

## 2015-02-06 MED ORDER — LIDOCAINE HCL (CARDIAC) 20 MG/ML IV SOLN
INTRAVENOUS | Status: DC | PRN
  Administered 2015-02-06: 40 mg via INTRAVENOUS

## 2015-02-06 MED ORDER — NEOSTIGMINE METHYLSULFATE 10 MG/10ML IV SOLN
INTRAVENOUS | Status: DC | PRN
  Administered 2015-02-06: 4 mg via INTRAVENOUS

## 2015-02-06 MED ORDER — MIDAZOLAM HCL 2 MG/2ML IJ SOLN
INTRAMUSCULAR | Status: AC
  Filled 2015-02-06: qty 2

## 2015-02-06 MED ORDER — PROPOFOL 10 MG/ML IV BOLUS
INTRAVENOUS | Status: DC | PRN
  Administered 2015-02-06: 150 mg via INTRAVENOUS

## 2015-02-06 MED ORDER — OXYCODONE HCL 5 MG/5ML PO SOLN: 5.0000 mg | Freq: Once | ORAL | Status: DC | PRN

## 2015-02-06 MED ORDER — DEXTROSE-NACL 5-0.45 % IV SOLN
INTRAVENOUS | Status: DC
Start: 2015-02-06 — End: 2015-02-07
  Administered 2015-02-06 – 2015-02-07 (×2): via INTRAVENOUS

## 2015-02-06 MED ORDER — GLYCOPYRROLATE 0.2 MG/ML IJ SOLN
INTRAMUSCULAR | Status: DC | PRN
  Administered 2015-02-06: 0.6 mg via INTRAVENOUS

## 2015-02-06 MED ORDER — HYDROMORPHONE HCL 2 MG PO TABS
2.0000 mg | ORAL_TABLET | ORAL | Status: DC | PRN
Start: 2015-02-06 — End: 2015-02-07
  Administered 2015-02-07: 4 mg via ORAL
  Administered 2015-02-07: 2 mg via ORAL
  Filled 2015-02-06: qty 1
  Filled 2015-02-06: qty 2

## 2015-02-06 MED ORDER — PROMETHAZINE HCL 25 MG/ML IJ SOLN
6.2500 mg | INTRAMUSCULAR | Status: DC | PRN
  Administered 2015-02-06: 6.25 mg via INTRAVENOUS
  Filled 2015-02-06: qty 1

## 2015-02-06 MED ORDER — 0.9 % SODIUM CHLORIDE (POUR BTL) OPTIME
TOPICAL | Status: DC | PRN
  Administered 2015-02-06 (×4): 1000 mL

## 2015-02-06 MED ORDER — FENTANYL CITRATE (PF) 100 MCG/2ML IJ SOLN
INTRAMUSCULAR | Status: DC | PRN
  Administered 2015-02-06 (×3): 50 ug via INTRAVENOUS
  Administered 2015-02-06: 100 ug via INTRAVENOUS

## 2015-02-06 MED ORDER — CEFAZOLIN SODIUM 1 G IJ SOLR
Freq: Once | INTRAMUSCULAR | Status: AC
Start: 2015-02-06 — End: 2015-02-06
  Administered 2015-02-06: 1000 mL
  Filled 2015-02-06: qty 1

## 2015-02-06 MED ORDER — HYDROMORPHONE HCL 1 MG/ML IJ SOLN
0.5000 mg | INTRAMUSCULAR | Status: DC | PRN
  Administered 2015-02-06: 0.5 mg via INTRAVENOUS
  Filled 2015-02-06: qty 1

## 2015-02-06 MED ORDER — MIDAZOLAM HCL 5 MG/5ML IJ SOLN
INTRAMUSCULAR | Status: DC | PRN
  Administered 2015-02-06: 2 mg via INTRAVENOUS

## 2015-02-06 MED ORDER — VARENICLINE TARTRATE 0.5 MG PO TABS
0.5000 mg | ORAL_TABLET | Freq: Two times a day (BID) | ORAL | Status: DC
  Filled 2015-02-06 (×4): qty 1

## 2015-02-06 MED ORDER — PROPOFOL 10 MG/ML IV BOLUS
INTRAVENOUS | Status: AC
  Filled 2015-02-06: qty 20

## 2015-02-06 MED ORDER — ROCURONIUM BROMIDE 50 MG/5ML IV SOLN
INTRAVENOUS | Status: AC
  Filled 2015-02-06: qty 1

## 2015-02-06 MED ORDER — MIDAZOLAM HCL 2 MG/2ML IJ SOLN
2.0000 mg | INTRAMUSCULAR | Status: DC | PRN
  Administered 2015-02-06: 2 mg via INTRAVENOUS

## 2015-02-06 MED ORDER — ONDANSETRON HCL 4 MG/2ML IJ SOLN
INTRAMUSCULAR | Status: DC | PRN
  Administered 2015-02-06 (×2): 4 mg via INTRAVENOUS

## 2015-02-06 MED ORDER — LIDOCAINE HCL (CARDIAC) 20 MG/ML IV SOLN
INTRAVENOUS | Status: AC
  Filled 2015-02-06: qty 5

## 2015-02-06 MED ORDER — METHOCARBAMOL 500 MG PO TABS
500.0000 mg | ORAL_TABLET | Freq: Four times a day (QID) | ORAL | Status: DC
  Filled 2015-02-06 (×2): qty 1

## 2015-02-06 MED ORDER — METOCLOPRAMIDE HCL 10 MG/10ML PO SOLN: 10.0000 mg | Freq: Three times a day (TID) | ORAL | Status: DC

## 2015-02-06 MED ORDER — METOCLOPRAMIDE HCL 5 MG/ML IJ SOLN
INTRAMUSCULAR | Status: AC
  Administered 2015-02-06: 10 mg
  Filled 2015-02-06: qty 2

## 2015-02-06 MED ORDER — TECHNETIUM TC 99M SULFUR COLLOID FILTERED
1.0000 | Freq: Once | INTRAVENOUS | Status: AC | PRN
  Administered 2015-02-06: 1 via INTRADERMAL

## 2015-02-06 MED ORDER — FENTANYL CITRATE (PF) 100 MCG/2ML IJ SOLN: 25.0000 ug | INTRAMUSCULAR | Status: DC | PRN

## 2015-02-06 SURGICAL SUPPLY — 83 items
APPLIER CLIP 9.375 MED OPEN (MISCELLANEOUS)
ATCH SMKEVC FLXB CAUT HNDSWH (FILTER) ×1 IMPLANT
BAG DECANTER FOR FLEXI CONT (MISCELLANEOUS) ×6 IMPLANT
BINDER BREAST LRG (GAUZE/BANDAGES/DRESSINGS) IMPLANT
BINDER BREAST XLRG (GAUZE/BANDAGES/DRESSINGS) ×3 IMPLANT
BIOPATCH RED 1 DISK 7.0 (GAUZE/BANDAGES/DRESSINGS) ×4 IMPLANT
BIOPATCH RED 1IN DISK 7.0MM (GAUZE/BANDAGES/DRESSINGS) ×2
BLADE 10 SAFETY STRL DISP (BLADE) ×3 IMPLANT
BLADE SURG 15 STRL LF DISP TIS (BLADE) ×1 IMPLANT
BLADE SURG 15 STRL SS (BLADE) ×2
CANISTER SUCTION 2500CC (MISCELLANEOUS) ×9 IMPLANT
CHLORAPREP W/TINT 26ML (MISCELLANEOUS) ×6 IMPLANT
CLIP APPLIE 9.375 MED OPEN (MISCELLANEOUS) IMPLANT
CLIP TI MEDIUM 24 (CLIP) ×3 IMPLANT
CLIP TI MEDIUM 6 (CLIP) ×3 IMPLANT
CLIP TI WIDE RED SMALL 6 (CLIP) ×3 IMPLANT
CONT SPEC 4OZ CLIKSEAL STRL BL (MISCELLANEOUS) ×6 IMPLANT
COVER PROBE W GEL 5X96 (DRAPES) ×3 IMPLANT
COVER SURGICAL LIGHT HANDLE (MISCELLANEOUS) ×6 IMPLANT
DERMABOND ADVANCED (GAUZE/BANDAGES/DRESSINGS) ×2
DERMABOND ADVANCED .7 DNX12 (GAUZE/BANDAGES/DRESSINGS) ×1 IMPLANT
DEVICE DISSECT PLASMABLAD 3.0S (MISCELLANEOUS) ×1 IMPLANT
DRAIN CHANNEL 19F RND (DRAIN) ×6 IMPLANT
DRAPE ORTHO SPLIT 77X108 STRL (DRAPES) ×4
DRAPE PROXIMA HALF (DRAPES) ×9 IMPLANT
DRAPE SURG 17X23 STRL (DRAPES) ×6 IMPLANT
DRAPE SURG ORHT 6 SPLT 77X108 (DRAPES) ×2 IMPLANT
DRAPE UTILITY XL STRL (DRAPES) ×6 IMPLANT
DRAPE WARM FLUID 44X44 (DRAPE) ×3 IMPLANT
DRSG PAD ABDOMINAL 8X10 ST (GAUZE/BANDAGES/DRESSINGS) ×6 IMPLANT
DRSG SORBAVIEW 3.5X5-5/16 MED (GAUZE/BANDAGES/DRESSINGS) ×6 IMPLANT
ELECT BLADE 6.5 EXT (BLADE) IMPLANT
ELECT CAUTERY BLADE 6.4 (BLADE) ×9 IMPLANT
ELECT REM PT RETURN 9FT ADLT (ELECTROSURGICAL) ×9
ELECTRODE REM PT RTRN 9FT ADLT (ELECTROSURGICAL) ×3 IMPLANT
EVACUATOR SILICONE 100CC (DRAIN) ×6 IMPLANT
EVACUATOR SMOKE ACCUVAC VALLEY (FILTER) ×2
GAUZE SPONGE 4X4 12PLY STRL (GAUZE/BANDAGES/DRESSINGS) ×3 IMPLANT
GLOVE BIO SURGEON STRL SZ 6 (GLOVE) ×3 IMPLANT
GLOVE BIO SURGEON STRL SZ 6.5 (GLOVE) ×2 IMPLANT
GLOVE BIO SURGEON STRL SZ7.5 (GLOVE) ×6 IMPLANT
GLOVE BIO SURGEONS STRL SZ 6.5 (GLOVE) ×1
GLOVE BIOGEL M 7.0 STRL (GLOVE) ×3 IMPLANT
GLOVE BIOGEL PI IND STRL 6.5 (GLOVE) ×2 IMPLANT
GLOVE BIOGEL PI IND STRL 7.5 (GLOVE) ×2 IMPLANT
GLOVE BIOGEL PI IND STRL 8 (GLOVE) ×1 IMPLANT
GLOVE BIOGEL PI INDICATOR 6.5 (GLOVE) ×4
GLOVE BIOGEL PI INDICATOR 7.5 (GLOVE) ×4
GLOVE BIOGEL PI INDICATOR 8 (GLOVE) ×2
GLOVE ECLIPSE 7.5 STRL STRAW (GLOVE) ×9 IMPLANT
GOWN STRL REUS W/ TWL LRG LVL3 (GOWN DISPOSABLE) ×2 IMPLANT
GOWN STRL REUS W/ TWL XL LVL3 (GOWN DISPOSABLE) ×2 IMPLANT
GOWN STRL REUS W/TWL 2XL LVL3 (GOWN DISPOSABLE) ×3 IMPLANT
GOWN STRL REUS W/TWL LRG LVL3 (GOWN DISPOSABLE) ×4
GOWN STRL REUS W/TWL XL LVL3 (GOWN DISPOSABLE) ×4
GRAFT FLEX HD 4X16 THICK (Tissue Mesh) ×3 IMPLANT
IMPL GEL SMOOTH RND 400CC (Breast) ×1 IMPLANT
IMPLANT GEL SMOOTH RND 400CC (Breast) ×3 IMPLANT
KIT BASIN OR (CUSTOM PROCEDURE TRAY) ×9 IMPLANT
KIT ROOM TURNOVER OR (KITS) ×6 IMPLANT
MARKER SKIN DUAL TIP RULER LAB (MISCELLANEOUS) ×3 IMPLANT
NS IRRIG 1000ML POUR BTL (IV SOLUTION) ×9 IMPLANT
PACK GENERAL/GYN (CUSTOM PROCEDURE TRAY) ×6 IMPLANT
PAD ARMBOARD 7.5X6 YLW CONV (MISCELLANEOUS) ×6 IMPLANT
PLASMABLADE 3.0S (MISCELLANEOUS) ×3
PREFILTER EVAC NS 1 1/3-3/8IN (MISCELLANEOUS) ×3 IMPLANT
SIZER BREAST GEL 450CC (SIZER) ×3 IMPLANT
SPONGE LAP 18X18 X RAY DECT (DISPOSABLE) ×3 IMPLANT
STAPLER VISISTAT 35W (STAPLE) ×3 IMPLANT
SUT ETHILON 2 0 FS 18 (SUTURE) ×3 IMPLANT
SUT MNCRL AB 3-0 PS2 18 (SUTURE) ×15 IMPLANT
SUT MNCRL AB 4-0 PS2 18 (SUTURE) ×6 IMPLANT
SUT MON AB 4-0 PC3 18 (SUTURE) ×3 IMPLANT
SUT PDS AB 3-0 SH 27 (SUTURE) IMPLANT
SUT PROLENE 3 0 PS 2 (SUTURE) ×9 IMPLANT
SUT VIC AB 3-0 SH 18 (SUTURE) ×6 IMPLANT
SUT VIC AB 3-0 SH 8-18 (SUTURE) ×3 IMPLANT
SYR BULB IRRIGATION 50ML (SYRINGE) ×3 IMPLANT
TOWEL OR 17X24 6PK STRL BLUE (TOWEL DISPOSABLE) ×9 IMPLANT
TOWEL OR 17X26 10 PK STRL BLUE (TOWEL DISPOSABLE) ×6 IMPLANT
TRAY FOLEY CATH 16FR SILVER (SET/KITS/TRAYS/PACK) IMPLANT
TUBE CONNECTING 12'X1/4 (SUCTIONS) ×2
TUBE CONNECTING 12X1/4 (SUCTIONS) ×4 IMPLANT

## 2015-02-06 NOTE — Transfer of Care (Signed)
Immediate Anesthesia Transfer of Care Note  Patient: Tricia Thomas  Procedure(s) Performed: Procedure(s): NIPPLE SPARING MASTECTOMY WITH SENTINAL LYMPH NODE BIOPSY (Right)  PLACEMENT OF RIGHT BREAST TISSUE EXPANDER OR POSSIBLE IMPLANT FOR BREAST RECONSTRUCTION (Right)  Patient Location: PACU  Anesthesia Type:General  Level of Consciousness: awake, alert , oriented and patient cooperative  Airway & Oxygen Therapy: Patient Spontanous Breathing and Patient connected to nasal cannula oxygen  Post-op Assessment: Report given to RN and Post -op Vital signs reviewed and stable  Post vital signs: Reviewed and stable  Last Vitals:  Filed Vitals:   02/06/15 1115  BP: 92/64  Pulse: 72  Temp:   Resp: 11    Complications: No apparent anesthesia complications

## 2015-02-06 NOTE — Anesthesia Preprocedure Evaluation (Addendum)
Anesthesia Evaluation  Patient identified by MRN, date of birth, ID band Patient awake    Reviewed: Allergy & Precautions, NPO status , Patient's Chart, lab work & pertinent test results  Airway Mallampati: II  TM Distance: >3 FB Neck ROM: Full    Dental  (+) Teeth Intact, Dental Advisory Given   Pulmonary former smoker,  breath sounds clear to auscultation        Cardiovascular Rhythm:Regular Rate:Normal     Neuro/Psych    GI/Hepatic   Endo/Other    Renal/GU      Musculoskeletal   Abdominal   Peds  Hematology   Anesthesia Other Findings   Reproductive/Obstetrics                            Anesthesia Physical Anesthesia Plan  ASA: II  Anesthesia Plan: General   Post-op Pain Management:    Induction:   Airway Management Planned: LMA  Additional Equipment:   Intra-op Plan:   Post-operative Plan:   Informed Consent: I have reviewed the patients History and Physical, chart, labs and discussed the procedure including the risks, benefits and alternatives for the proposed anesthesia with the patient or authorized representative who has indicated his/her understanding and acceptance.   Dental advisory given  Plan Discussed with: CRNA and Anesthesiologist  Anesthesia Plan Comments:         Anesthesia Quick Evaluation

## 2015-02-06 NOTE — Progress Notes (Signed)
Transported by Ander Purpura, NT

## 2015-02-06 NOTE — Brief Op Note (Signed)
02/06/2015  3:36 PM  PATIENT:  Tricia Thomas  49 y.o. female  PRE-OPERATIVE DIAGNOSIS:  RIGHT BREAST CANCER  POST-OPERATIVE DIAGNOSIS:  RIGHT BREAST CANCER  PROCEDURE:  Procedure(s): NIPPLE SPARING MASTECTOMY WITH SENTINAL LYMPH NODE BIOPSY (Right)  PLACEMENT OF RIGHT BREAST TISSUE EXPANDER OR POSSIBLE IMPLANT FOR BREAST RECONSTRUCTION (Right)  SURGEON:  Surgeon(s) and Role: Panel 1:    * Stark Klein, MD - Primary  Panel 2:    * Crissie Reese, MD - Primary  PHYSICIAN ASSISTANT:   ASSISTANTS: none   ANESTHESIA:   general  EBL:  Total I/O In: 1000 [I.V.:1000] Out: 440 [Urine:300; Blood:140]  BLOOD ADMINISTERED:none  DRAINS: (2) Jackson-Pratt drain(s) with closed bulb suction in the right mastectomy space   LOCAL MEDICATIONS USED:  NONE  SPECIMEN:  No Specimen  DISPOSITION OF SPECIMEN:  N/A  COUNTS:  YES  TOURNIQUET:  * No tourniquets in log *  DICTATION: .Other Dictation: Dictation Number [id number]  PLAN OF CARE: Admit to inpatient   PATIENT DISPOSITION:  PACU - hemodynamically stable.   Delay start of Pharmacological VTE agent (>24hrs) due to surgical blood loss or risk of bleeding: no

## 2015-02-06 NOTE — Op Note (Signed)
Right Nipple Sparing Mastectomy with Sentinel Node Biopsy Procedure Note  Indications: This patient presents with history of right breast cancer with clinically negative axillary lymph node exam.  Pre-operative Diagnosis: right breast cancer,  cTis  Post-operative Diagnosis: right breast cancer, same  Surgeon: Stark Klein   Anesthesia: General endotracheal anesthesia and Local anesthesia 0.25.% bupivacaine, with epinephrine  ASA Class: 2  Procedure Details  The patient was seen in the Holding Room. The risks, benefits, complications, treatment options, and expected outcomes were discussed with the patient. The possibilities of reaction to medication, pulmonary aspiration, bleeding, infection, the need for additional procedures, failure to diagnose a condition, and creating a complication requiring transfusion or operation were discussed with the patient. The patient concurred with the proposed plan, giving informed consent.  The site of surgery properly noted/marked. The patient was taken to Operating Room # 9, identified as Hillarie Pontarelli and the procedure verified as Right nipple sparing Mastectomy and Sentinel Node Biopsy. A Time Out was held and the above information confirmed.    Using a hand-held gamma probe, axillary sentinel nodes were identified.  Two level 2 axillary sentinel nodes were removed and submitted to pathology.  The findings are below.  The lymphovascular channels were clipped with metal clips.  After induction of anesthesia, the right arm, breast, and chest were prepped and draped in standard fashion.   The borders of the breast were identified and marked.  A 6 cm incision was made in the inframammary fold.  Mastectomy hooks were used to provide elevation of the superior skin edge, and the PlasmaBlade was used to create the mastectomy flaps.  The dissection was taken to the fascia of the pectoralis major.  The penetrating vessels were clipped.  The superior flap was taken  medially to the lateral sternal border, superiorly to the inferior border of the clavicle, and laterally to the border of the latissimus.  The breast was taken off including the pectoralis fascia and the axillary tail marked.  The tissue under the nipple was also marked.          The breast was irrigated and hemostasis was achieved with cautery.  The patient was left with Dr. Harlow Mares for reconstruction.  Counts were correct prior to me leaving the operating room  Findings: grossly clear surgical margins.  Two SLNs.  #1 is hot with cps 550.  #2 palpable.    Estimated Blood Loss:  less than 50 mL         Drains: per Dr. Harlow Mares.                  Specimens: R breast and two axillary sentinel nodes         Complications:  None; patient tolerated the procedure well.         Disposition: in OR with Dr. Harlow Mares.           Condition: stable

## 2015-02-06 NOTE — Anesthesia Postprocedure Evaluation (Signed)
  Anesthesia Post-op Note  Patient: Tricia Thomas  Procedure(s) Performed: Procedure(s): NIPPLE SPARING MASTECTOMY WITH SENTINAL LYMPH NODE BIOPSY (Right)  PLACEMENT OF RIGHT BREAST TISSUE EXPANDER OR POSSIBLE IMPLANT FOR BREAST RECONSTRUCTION (Right)  Patient Location: PACU  Anesthesia Type:General and GA combined with regional for post-op pain  Level of Consciousness: awake, alert  and oriented  Airway and Oxygen Therapy: Patient Spontanous Breathing  Post-op Pain: mild  Post-op Assessment: Post-op Vital signs reviewed, Patient's Cardiovascular Status Stable, Respiratory Function Stable and Patent Airway              Post-op Vital Signs: stable  Last Vitals:  Filed Vitals:   02/06/15 1700  BP: 103/62  Pulse: 60  Temp: 36.7 C  Resp: 14    Complications: No apparent anesthesia complications

## 2015-02-06 NOTE — Interval H&P Note (Signed)
History and Physical Interval Note:  02/06/2015 11:22 AM  Tricia Thomas  has presented today for surgery, with the diagnosis of RIGHT BREAST CANCER  The various methods of treatment have been discussed with the patient and family. After consideration of risks, benefits and other options for treatment, the patient has consented to  Procedure(s): NIPPLE SPARING MASTECTOMY WITH SENTINAL LYMPH NODE BIOPSY (Right)  PLACEMENT OF RIGHT BREAST TISSUE EXPANDER OR POSSIBLE IMPLANT FOR BREAST RECONSTRUCTION (Right) as a surgical intervention .  The patient's history has been reviewed, patient examined, no change in status, stable for surgery.  I have reviewed the patient's chart and labs.  Questions were answered to the patient's satisfaction.     Shalese Strahan

## 2015-02-06 NOTE — H&P (Signed)
Tricia Thomas Location: [facility] Patient #: [id number] DOB: 12/30/1965 Married / Language: English / Race: White Female  History of Present Illness  The patient is a 49 year old female who presents with breast cancer. Previous history Tricia Thomas is a 49 year old female who is referred by Dr. Carol Ada for consultation regarding a diagnosis of new right breast cancer. She presented with increase in microcalcifications on 6 month follow-up. She states she is also had multiple follow-up exams for left-sided abnormalities. She did undergo diagnostic mammography and ultrasound on the right. She was seen to have a 2.2 cm area of abnormality in the upper outer quadrant of the right breast. 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 She has not had biopsies on any other breast abnormalities. She does not have any personal or family history of cancer. She is a one pack-a-day smoker. She does not drink alcohol or use illicit drugs. She had menarche at age 35. She has not had any periods since 1989 when she had a hysterectomy. She has not used hormonal contraception or HRT. She did have 2 children and had the first one at age 83. She has not ever had a colonoscopy or bone density study which is consistent with appropriate screening recommendations for her age. She underwent MRI and was found to have several other areas of concern including a possible lymph node. She is here to discuss the next steps. ] She has met with Dr. Harlow Mares and would like to do nipple sparing mastectomy with expander reconstruction on the right. We will plan this and SLN bx.    Allergies No Known Drug Allergies\  Medication History  Chantix (0.5MG Tablet, Oral) Active. Medications Reconciled  Review of Systems  All other systems negative   Vitals  Wt Readings from Last 3 Encounters:  01/31/15 55.747 kg (122 lb 14.4 oz)  11/08/14 53.615 kg (118 lb 3.2 oz)  05/05/13 63.504 kg  (140 lb)   Temp Readings from Last 3 Encounters:  01/31/15 98.6 F (37 C)   11/08/14 98.3 F (36.8 C) Oral   BP Readings from Last 3 Encounters:  01/31/15 102/68  11/08/14 123/75  05/05/13 110/70   Pulse Readings from Last 3 Encounters:  01/31/15 74  11/08/14 79  05/05/13 80     Physical ExamGeneral Mental Status-Alert. General Appearance-Consistent with stated age. Hydration-Well hydrated. Voice-Normal.  Chest and Lung Exam Chest and lung exam reveals -quiet, even and easy respiratory effort with no use of accessory muscles. Inspection Chest Wall - Normal. Back - normal.  Breast Note: no palpable masses or skin dimpling. No nipple retraction. no lymphadenopathy.   Cardiovascular Cardiovascular examination reveals -normal pedal pulses bilaterally. Note: regular rate and rhythm    Assessment & Plan  PRIMARY CANCER OF UPPER OUTER QUADRANT OF RIGHT BREAST (174.4  C50.411) Impression: We will schedule nipple sparing mastectomy with sentinel node biopsy and immediate reconstruction. She will likely have breast lift on the opposite breast later on down the line. We discussed left breast and are not going to do a prophylactic mastectomy on that side.  The surgical procedure was described to the patient. I discussed the incision type and location and that we would need radiology involved on with a wire or seed marker and/or sentinel node.  The risks and benefits of the procedure were described to the patient and she wishes to proceed.  We discussed the risks bleeding, infection, damage to other structures, need for further procedures/surgeries. We  discussed the risk of seroma. The patient was advised if the area in the breast in cancer, we may need to go back to surgery for additional tissue to obtain negative margins or for a lymph node biopsy. The patient was advised that these are the most common complications, but that others can occur as well. They were  advised against taking aspirin or other anti-inflammatory agents/blood thinners the week before surgery. Current Plans  Pt Education - flb breast cancer surgery: discussed with patient and provided information.

## 2015-02-07 ENCOUNTER — Ambulatory Visit (HOSPITAL_COMMUNITY): Payer: BLUE CROSS/BLUE SHIELD

## 2015-02-07 ENCOUNTER — Encounter (HOSPITAL_COMMUNITY): Payer: Self-pay | Admitting: General Surgery

## 2015-02-07 MED ORDER — DOCUSATE SODIUM 100 MG PO CAPS: 100.0000 mg | ORAL_CAPSULE | Freq: Every day | ORAL | Status: DC

## 2015-02-07 MED ORDER — ENOXAPARIN SODIUM 40 MG/0.4ML ~~LOC~~ SOLN: 40.0000 mg | Freq: Every day | SUBCUTANEOUS | Status: DC

## 2015-02-07 MED ORDER — ENOXAPARIN (LOVENOX) PATIENT EDUCATION KIT
PACK | Freq: Once | Status: DC
  Filled 2015-02-07: qty 1

## 2015-02-07 MED ORDER — CEPHALEXIN 500 MG PO CAPS: 500.0000 mg | ORAL_CAPSULE | Freq: Three times a day (TID) | ORAL | Status: DC

## 2015-02-07 MED ORDER — CEPHALEXIN 500 MG PO CAPS
500.0000 mg | ORAL_CAPSULE | Freq: Three times a day (TID) | ORAL | Status: DC
  Administered 2015-02-07: 500 mg via ORAL
  Filled 2015-02-07: qty 1

## 2015-02-07 MED ORDER — ENOXAPARIN SODIUM 40 MG/0.4ML ~~LOC~~ SOLN
40.0000 mg | Freq: Every day | SUBCUTANEOUS | Status: DC
  Administered 2015-02-07: 40 mg via SUBCUTANEOUS
  Filled 2015-02-07: qty 0.4

## 2015-02-07 MED ORDER — METHOCARBAMOL 500 MG PO TABS: 500.0000 mg | ORAL_TABLET | Freq: Four times a day (QID) | ORAL | Status: DC

## 2015-02-07 MED ORDER — HYDROMORPHONE HCL 2 MG PO TABS: 2.0000 mg | ORAL_TABLET | ORAL | Status: DC | PRN

## 2015-02-07 NOTE — Discharge Instructions (Addendum)
No lifting for 6 weeks No vigorous activity for 6 weeks (including outdoor walks) No driving for 4 weeks OK to walk up stairs slowly Stay propped up Use incentive spirometer at home every hour while awake No shower while drains are in place Empty drains at least three times a day and record the amounts separately Change drain dressings every third day if instructed to do so by Dr. Harlow Mares  Apply Bacitracin antibiotic ointment to the drain sites  Place gauze dressing over drains  Secure the gauze with tape Take an over-the-counter Probiotic while on antibiotics Take an over-the-counter stool softener (such as Colace) while on pain medication See Dr. Harlow Mares in office next week For questions call [phone] or [phone]

## 2015-02-07 NOTE — Progress Notes (Signed)
Lovenox cost at pharmacy $15.00. Jonnie Finner RN CCM Case Mgmt phone [phone]

## 2015-02-07 NOTE — Discharge Summary (Signed)
Physician Discharge Summary  Patient ID: Tricia Thomas MRN: [id number] DOB/AGE: May 03, 1966 49 y.o.  Admit date: 02/06/2015 Discharge date: 02/07/2015  Admission Diagnoses: Right breast cancer  Discharge Diagnoses: Same Active Problems:   Breast cancer, female   Discharged Condition: good  Hospital Course: On the day of admission the patient was taken to surgery and had right mastectomy, sentinel node, implant placement with ADM. The patient tolerated the procedures well. Postoperatively, the mastectomy flap maintained excellent color and capillary refill.Nipple complex appears viable. The patient was ambulatory and tolerating diet on the first postoperative day. She would like to be discharged.  Treatments: antibiotics: Ancef, anticoagulation: heparin and surgery: right mastectomy, sentinel node, and reconstruction with silicone gel implant and acellular dermal matrix.  Discharge Exam: Blood pressure 90/57, pulse 61, temperature 98.4 F (36.9 C), temperature source Oral, resp. rate 17, height 5\' 5"  (1.651 m), weight 122 lb (55.339 kg), SpO2 97 %.  Operative sites: Mastectomy flaps viable. Implant in good position. Drains functioning. Drainage thin. There is no evidence of bleeding or infection.  Disposition: Discharge home.     Medication List    STOP taking these medications        MULTIVITAMIN PO     OVER THE COUNTER MEDICATION      TAKE these medications        cephALEXin 500 MG capsule  Commonly known as:  KEFLEX  Take 1 capsule (500 mg total) by mouth every 8 (eight) hours.     docusate sodium 100 MG capsule  Commonly known as:  COLACE  Take 1 capsule (100 mg total) by mouth daily.     enoxaparin 40 MG/0.4ML injection  Commonly known as:  LOVENOX  Inject 0.4 mLs (40 mg total) into the skin daily.     HYDROmorphone 2 MG tablet  Commonly known as:  DILAUDID  Take 1-2 tablets (2-4 mg total) by mouth every 4 (four) hours as needed for moderate pain.     methocarbamol 500 MG tablet  Commonly known as:  ROBAXIN  Take 1 tablet (500 mg total) by mouth 4 (four) times daily.     varenicline 0.5 MG tablet  Commonly known as:  CHANTIX  Take 1 tablet (0.5 mg total) by mouth 2 (two) times daily.         SignedHarlow Mares, Lidwina Kaner M 02/07/2015, 7:21 AM

## 2015-02-07 NOTE — Op Note (Signed)
NAMEPATTRICIA, Tricia Thomas NO.:  [id number]  MEDICAL RECORD NO.:  [id number]  LOCATION:  [facility]                          FACILITY:  [facility]  PHYSICIAN:  Crissie Reese, M.D.     DATE OF BIRTH:  1965-09-04  DATE OF PROCEDURE:  02/06/2015 DATE OF DISCHARGE:                              OPERATIVE REPORT   PREOPERATIVE DIAGNOSIS:  Right breast cancer.  POSTOPERATIVE DIAGNOSIS:  Right breast cancer.  PROCEDURE PERFORMED: 1. An immediate right breast reconstruction with silicone gel implant. 2. Chest wall reconstruction for inadequate muscle and resetting of     inframammary crease, using acellular dermal matrix.  SURGEON:  Crissie Reese, M.D.  ANESTHESIA:  General.  ESTIMATED BLOOD LOSS:  15 mL.  DRAINS:  Two 19-French.  CLINICAL NOTE:  A 49 year old woman, who has right breast cancer and is having mastectomy.  She preferred a nipple-sparing mastectomy.  She was interested in reconstruction.  Options were discussed and she elected placement of either a tissue expander or implant as an immediate reconstruction.  She understood the use of acellular dermal matrix.  The nature of the material and the rationale for its use.  Risks were discussed, which included, but were not limited to, bleeding, infection, healing problems, scarring, loss of sensation, fluid accumulations, loss of sensation in the nipple, loss of nipple, loss of skin, asymmetry, contour deformities, contour deformities of the periphery of the reconstruction, pneumothorax, DVT, PE, failure of the device, capsular contracture, displacement of the device, wrinkles and ripples, chronic pain, and overall disappointment.  She understood all this and wished to proceed.  DESCRIPTION OF PROCEDURE:  The patient was in the operating room and the nipple-sparing mastectomy was then completed.  The flap was inspected and found to have good color, as we did the nipple-areolar complex. Thorough irrigation with saline as  well as antibiotic solution. Hemostasis with electrocautery.  The pectoralis major and the laterally serratus anterior were elevated with great care taken to avoid damage to underlying chest cavity.  Hemostasis with electrocautery.  A space was developed to the dimensions of the implant.  A 450 mL sizer was soaked in antibiotic solution and was placed and was found to be a little bit large.  The breast weight was somewhat under 400 g according to pathology.  It was decided to go with a 854 mL silicone gel implant. Thorough irrigation with saline as well as antibiotic solution.  The acellular dermal matrix had been soaked in saline and soaked in antibiotic solution as well for a total of 30 minutes.  It was pie crusted.  After thoroughly cleaning gloves, the acellular dermal matrix having been positioned and secured along the inferior border with 3-0 PDS simple interrupted sutures.  The implant was soaked in antibiotic solution and then it was placed again with after thoroughly cleaning gloves and care was to make sure that it was in good position and oriented properly.  The superior edge of the acellular dermal matrix was then close to the muscle using 3-0 PDS simple interrupted sutures with great care taken to avoid damage to the underlying implant, which was kept under direct vision all times.  Antibiotic solution had been  placed in the space just prior to placement of the implant, after confirming the hemostasis, and it was placed again on top of the implant prior to the final closure of the acellular dermal matrix to the muscle.  Two 19- French drains were positioned; 1 lateral and 1 medial, both of which were brought out inferior and secured with a 3-0 Prolene sutures.  One was placed into the axilla and the other underlying the mastectomy flaps.  These were secured with 3-0 Prolene sutures and the wound was then debrided along the superior border, where retraction was  then performed, the superior edge back to good bleeding edges, and then the skin closure with 3-0 Monocryl interrupted and inverted deep dermal sutures and a few interrupted 3-0 Prolene simple sutures for support. Dermabond was applied.  Dry sterile dressings with Biopatch for the drains, with a SorbaView dressings over that and the chest vest was positioned.  She was transferred to the recovery room in stable, having tolerated the procedure well and the disposition, she will be admitted to the hospital.     Crissie Reese, M.D.     DB/MEDQ  D:  02/06/2015  T:  02/07/2015  Job:  [id number]

## 2015-02-07 NOTE — Progress Notes (Signed)
1 Day Post-Op  Subjective: Good pain control.  Doing well.  No n/v.    Objective: Vital signs in last 24 hours: Temp:  [97.6 F (36.4 C)-98.4 F (36.9 C)] 98.4 F (36.9 C) (07/13 0607) Pulse Rate:  [57-80] 61 (07/13 0607) Resp:  [10-20] 17 (07/13 0607) BP: (87-137)/(57-85) 90/57 mmHg (07/13 0607) SpO2:  [97 %-100 %] 97 % (07/13 0607) FiO2 (%):  [2 %] 2 % (07/12 1700) Weight:  [55.339 kg (122 lb)] 55.339 kg (122 lb) (07/12 0847)    Intake/Output from previous day: 07/12 0701 - 07/13 0700 In: 1650 [I.V.:1650] Out: 667 [Urine:375; Drains:102; Blood:190] Intake/Output this shift: Total I/O In: -  Out: 90 [Drains:90]  General appearance: alert, cooperative and no distress Resp: breathing comfortably Chest wall: anticipated right chest wall tenderness.  no evidence of hematoma.  nipple without necrosis  Lab Results:  No results for input(s): WBC, HGB, HCT, PLT in the last 72 hours. BMET No results for input(s): NA, K, CL, CO2, GLUCOSE, BUN, CREATININE, CALCIUM in the last 72 hours. PT/INR No results for input(s): LABPROT, INR in the last 72 hours. ABG No results for input(s): PHART, HCO3 in the last 72 hours.  Invalid input(s): PCO2, PO2  Studies/Results: Nm Sentinel Node Inj-no Rpt (breast)  02/06/2015   CLINICAL DATA: right breast cancer   Sulfur colloid was injected intradermally by the nuclear medicine  technologist for breast cancer sentinel node localization.     Anti-infectives: Anti-infectives    Start     Dose/Rate Route Frequency Ordered Stop   02/06/15 1800  ceFAZolin (ANCEF) IVPB 1 g/50 mL premix     1 g 100 mL/hr over 30 Minutes Intravenous 4 times per day 02/06/15 1707     02/06/15 1100  bacitracin 50,000 Units, gentamicin (GARAMYCIN) 80 mg, ceFAZolin (ANCEF) 1 g in sodium chloride 0.9 % 1,000 mL      Irrigation Once 02/06/15 1052 02/06/15 1244   02/06/15 1045  ceFAZolin (ANCEF) IVPB 2 g/50 mL premix     2 g 100 mL/hr over 30 Minutes Intravenous To  [facility] 02/05/15 1453 02/06/15 1505      Assessment/Plan: s/p Procedure(s): NIPPLE SPARING MASTECTOMY WITH SENTINAL LYMPH NODE BIOPSY (Right)  PLACEMENT OF RIGHT BREAST TISSUE EXPANDER OR POSSIBLE IMPLANT FOR BREAST RECONSTRUCTION (Right) d/c home today  Follow up with dr. Harlow Mares next week. Follow up with me in 2.5-3 weeks. Await pathology.    LOS: 1 day    [facility] 02/07/2015

## 2015-02-19 ENCOUNTER — Ambulatory Visit: Payer: BLUE CROSS/BLUE SHIELD | Admitting: Oncology

## 2015-02-19 NOTE — Progress Notes (Signed)
No show

## 2015-02-20 ENCOUNTER — Other Ambulatory Visit: Payer: Self-pay | Admitting: Oncology

## 2015-02-21 ENCOUNTER — Telehealth: Payer: Self-pay | Admitting: Oncology

## 2015-02-21 NOTE — Telephone Encounter (Signed)
Patient came in today stating that she had an appointment today

## 2015-02-21 NOTE — Telephone Encounter (Signed)
Patient stopped by as she thought her appointment was today but it was 10:30 02/19/15.  She states the automated system advised of 7/27 and i have had [facility] dale check this for me.  I have sent a message to Dr Jana Hakim to advise when he can see her and she is aware that i will call her with a new appointment

## 2015-02-22 ENCOUNTER — Telehealth: Payer: Self-pay | Admitting: Oncology

## 2015-02-22 NOTE — Telephone Encounter (Signed)
Called patient with a new appointment and her phone/home number has a full mailbox,i left a message with a new appointment on her husbands phone listed

## 2015-02-28 ENCOUNTER — Other Ambulatory Visit: Payer: Self-pay | Admitting: Oncology

## 2015-03-05 NOTE — Progress Notes (Signed)
Location of Breast Cancer: Right Mastectomy   Histology per Pathology Report:    Receptor Status: ER(100%), PR (100%), Her2-neu ()  Did patient present with symptoms (if so, please note symptoms) or was this found on screening mammography?: Mammograph-March 2016  Past/Anticipated interventions by surgeon, if any: Nipple sparing mastectomy with sentinal lymph node biopsy  Past/Anticipated interventions by medical oncology, if any: Dr. Jana Hakim 03/16/15  Lymphedema issues, if any:  no  Pain issues, if any:  yes intermittent and stabbing rating pain a 7/10 only when pushing, pulling or lifting.   SAFETY ISSUES:  Prior radiation? no  Pacemaker/ICD? no  Possible current pregnancy?no  Is the patient on methotrexate? no  Current Complaints / other details:  Pleasant lady, married, has two adult children, two grandchildren that live with her.  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      Jenene Slicker, RN 03/05/2015,5:16 PM

## 2015-03-07 ENCOUNTER — Ambulatory Visit
Admission: RE | Admit: 2015-03-07 | Discharge: 2015-03-07 | Disposition: A | Discharge status: Home / Self Care | Payer: BLUE CROSS/BLUE SHIELD | Source: Ambulatory Visit | Attending: Radiation Oncology | Admitting: Radiation Oncology

## 2015-03-07 ENCOUNTER — Encounter: Payer: Self-pay | Admitting: Radiation Oncology

## 2015-03-07 VITALS — BP 88/59 | HR 68 | Temp 97.9°F | Resp 12 | Wt 123.6 lb

## 2015-03-07 DIAGNOSIS — C50511 Malignant neoplasm of lower-outer quadrant of right female breast: Secondary | ICD-10-CM

## 2015-03-07 DIAGNOSIS — Z170 Estrogen receptor positive status [ER+]: Secondary | ICD-10-CM | POA: Insufficient documentation

## 2015-03-07 DIAGNOSIS — Z510 Encounter for antineoplastic radiation therapy: Secondary | ICD-10-CM | POA: Diagnosis not present

## 2015-03-07 HISTORY — DX: Malignant neoplasm of unspecified site of unspecified female breast: C50.919

## 2015-03-07 NOTE — Progress Notes (Signed)
Radiation Oncology         [phone] ________________________________  Name: Tricia Thomas MRN: [id number]  Date: 03/07/2015  DOB: 06/20/1966  Follow-Up Visit Note  Outpatient  CC: Reginia Naas, MD  Stark Klein, MD  Diagnosis:     ICD-9-CM ICD-10-CM   1. 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  Narrative: The patient returns today for follow-up.     Since consultation, she underwent nipple sparring right simple mastectomy and removal of two axillary lymph nodes on 02/06/2015.  Pathology reveled a 1.6cm area of DCIS with characteristics as described above in the diagnosis. She did have a focal positive anterior margin, which I discussed with Dr. Barry Dienes this morning. An implant was placed on the right side after mastectomy was performed.  She is happy to be returning to work soon.   Past/Anticipated interventions by medical oncology, if any: Dr. Jana Hakim 03/16/2015  Lymphedema issues, if any:  no  Pain issues, if any:  yes intermittent and stabbing rating pain a 7/10 only when pushing, pulling or lifting.   SAFETY ISSUES:  Prior radiation? no  Pacemaker/ICD? no  Possible current pregnancy?no  Is the patient on methotrexate? no  Current Complaints / other details:  married, has two adult children, two grandchildren that live with her. 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           ALLERGIES:  has No Known Allergies.  Meds: Current Outpatient Prescriptions  Medication Sig Dispense Refill  . calcium-vitamin D (OSCAL WITH D) 500-200 MG-UNIT per tablet Take 2 tablets by mouth.    . Multiple Vitamin (MULTIVITAMIN) tablet Take 1 tablet by mouth daily.    Marland Kitchen docusate sodium (COLACE) 100 MG capsule Take 1 capsule (100 mg total) by mouth  daily. (Patient not taking: Reported on 03/07/2015) 10 capsule 0  . methocarbamol (ROBAXIN) 500 MG tablet Take 1 tablet (500 mg total) by mouth 4 (four) times daily. (Patient not taking: Reported on 03/07/2015) 40 tablet 1  . oxyCODONE-acetaminophen (PERCOCET/ROXICET) 5-325 MG per tablet Take 1 tablet by mouth every 4 (four) hours as needed.  0  . varenicline (CHANTIX) 0.5 MG tablet Take 1 tablet (0.5 mg total) by mouth 2 (two) times daily. (Patient not taking: Reported on 03/07/2015) 60 tablet 3   No current facility-administered medications for this encounter.    Physical Findings:  weight is 123 lb 9.6 oz (56.065 kg). Her oral temperature is 97.9 F (36.6 C). Her blood pressure is 88/59 and her pulse is 68. Her respiration is 12 and oxygen saturation is 100%. .     General: Alert and oriented, in no acute distress HEENT: Head is normocephalic. Extraocular movements are intact. Oropharynx is clear.  Neck: Neck is supple, no palpable cervical or supraclavicular lymphadenopathy. Heart: Regular in rate and rhythm with no murmurs, rubs, or gallops. Chest: Clear to auscultation bilaterally, with no rhonchi, wheezes, or rales. Abdomen: Soft, nontender, nondistended, with no rigidity or guarding. Extremities: No cyanosis or edema. Psychiatric: Judgment and insight are intact. Affect is appropriate. Breast exam reveals: Healing over her mastectomy scar and sutures are still in place, but no drains; implant in place (right)  Lab Findings: Lab Results  Component Value Date   WBC 7.5 01/31/2015   HGB 12.4 01/31/2015  HCT 36.9 01/31/2015   MCV 91.1 01/31/2015   PLT 264 01/31/2015      Radiographic Findings: Nm Sentinel Node Inj-no Rpt (breast)  02/06/2015   CLINICAL DATA: right breast cancer   Sulfur colloid was injected intradermally by the nuclear medicine  technologist for breast cancer sentinel node localization.     Impression/Plan: Kariah Loredo is a 49 year old female presenting to  clinic in regards to her cancer of the right female breast.  We had a discussion regards her positive focal margin after mastectomy. I have conducted a review of the literature and based on the literature, her risk of a recurrence, particularly if she takes hormonal therapy would be less than ten percent.The purpose and benefits of administering an anti-estrogen pill moving forward were discussed. The traditional process and administration of patients receiving an anti-estrogen pill treatment were discussed.  She no-showed for her appointment with Dr. Jana Hakim and this has been re-schduled. I do not think that the potential morbiddity of radiotherapy is warranted, given her relatively low risk of recurrance. Additionally, I cannot quote any survival benefit with radiotherapy. I encouraged her to see Dr. Jana Hakim later this month to discuss anti-estrogen therapy. I wished her the very best. All questions and concerns vocalized by the patient were addressed fully. If the patient develops any further questions or concerns in regards to her treatment, she is encouraged to contact Dr. Isidore Moos, MD. Encouraged to follow up with radiation oncology as needed. She is advised of her appointment with Dr. Jana Hakim to take place on August 19th of 2016.  The patient has been advised that if she develops any further questions or concerns in regards to her treatment, that she is encouraged to contact Dr. Isidore Moos, MD.    This document serves as a record of services personally performed by Eppie Gibson, MD. It was created on her behalf by Lenn Cal, a trained medical scribe. The creation of this record is based on the scribe's personal observations and the provider's statements to them. This document has been checked and approved by the attending provider. _____________________________________   Eppie Gibson, MD

## 2015-03-16 ENCOUNTER — Ambulatory Visit (HOSPITAL_BASED_OUTPATIENT_CLINIC_OR_DEPARTMENT_OTHER): Payer: BLUE CROSS/BLUE SHIELD | Admitting: Oncology

## 2015-03-16 VITALS — BP 114/70 | HR 64 | Temp 98.1°F | Resp 18 | Ht 65.0 in | Wt 125.2 lb

## 2015-03-16 DIAGNOSIS — Z170 Estrogen receptor positive status [ER+]: Secondary | ICD-10-CM

## 2015-03-16 DIAGNOSIS — Z720 Tobacco use: Secondary | ICD-10-CM

## 2015-03-16 DIAGNOSIS — D0511 Intraductal carcinoma in situ of right breast: Secondary | ICD-10-CM

## 2015-03-16 DIAGNOSIS — C50511 Malignant neoplasm of lower-outer quadrant of right female breast: Secondary | ICD-10-CM

## 2015-03-16 MED ORDER — TAMOXIFEN CITRATE 20 MG PO TABS: 20.0000 mg | ORAL_TABLET | Freq: Every day | ORAL | Status: AC

## 2015-03-16 NOTE — Progress Notes (Signed)
[facility]  Telephone:([phone] Fax:(336) [phone]     ID: Tricia Thomas DOB: [birth year]  MR#: [id number]  PPI#:[id number]  Patient Care Team: Tricia Ada, MD as PCP - General (Family Medicine) Tricia Klein, MD as Consulting Physician (General Surgery) Tricia Cruel, MD as Consulting Physician (Oncology) Tricia Gibson, MD as Attending Physician (Radiation Oncology) Tricia Kaufmann, RN as Registered Nurse Tricia Germany, RN as Registered Nurse PCP: Tricia Naas, MD GYN: OTHER MD: Tricia Reese MD  CHIEF COMPLAINT: Ductal carcinoma in situ  CURRENT TREATMENT: tamoxifen   BREAST CANCER HISTORY: From the original intake note:  Tricia Thomas had her first ever mammography at [facility] 09/03/2013. The breast composition was category C. Heterogeneous calcifications were noted in the left breast and she was recalled for additional views 09/08/2013. Grouped and scattered round calcifications in the left breast posteriorly were felt to be most likely benign, but 6 month follow-up was recommended. This was performed 03/20/2014. This time there was no mammographic evidence of malignancy.  On 10/24/2014 however when the patient had her annual follow-up mammography, in addition to the benign appearing calcifications previously noted, there was a 2.2 cm area of calcifications in the right breast now, upper outer quadrant, which appeared to have increased in number. Biopsy of this area 10/26/2014 showed ([id number]) ductal carcinoma in situ, grade 1, estrogen receptor 100% positive and progesterone receptor 100% positive, both with strong staining intensity.  Her subsequent history is as detailed below   INTERVAL HISTORY: Tricia Thomas returns today for follow-up of her noninvasive breast cancer. On 02/06/2015 she underwent right mastectomy with sentinel lymph node sampling. This showed ductal carcinoma in situ measuring approximately 1.6 cm. There was a focally positive anterior  margin. The tumor was low-grade. Both sentinel lymph nodes obtained were negative. She had immediate silicone gel implant reconstruction.  The margin question was discussed at multidisciplinary breast cancer conference 02/21/2015. This is focally at ink. and surgery does not feel they can locate this. Accordingly further resection would not be possible. This suggested consideration of radiation oncology. The patient met with Dr. Isidore Thomas on August 10. Dr. Isidore Thomas felt if the patient took hormonal therapy the risk of local recurrence would be less than 10%. Dr. Isidore Thomas did not recommend radiation. She strongly suggested the patient taken antiestrogen's Patient is here to discuss those options   REVIEW OF SYSTEMS: Tricia Thomas did well with the surgery, with no unusual pain, bleeding, fever, or other complications. She is working through her dental braces. She feels a little bit stiff postop but is doing some stretching exercises. She is hoping to get back to work next week. A detailed review of systems was otherwise stable.  PAST MEDICAL HISTORY: Past Medical History  Diagnosis Date  . Syncope     sudden onset  . Dysrhythmia   . H/O sinus tachycardia 2014    evaluated By Cardiologist, Dr Tricia Thomas  . Constipation     takes Miralax  . Breast cancer of lower-outer quadrant of right female breast 11/01/2014  . Breast cancer     PAST SURGICAL HISTORY: Past Surgical History  Procedure Laterality Date  . Appendectomy  1990  . Wisdom tooth extraction    . Mastectomy complete / simple w/ sentinel node biopsy Right 02/06/2015  . Reconstruction breast immediate / delayed w/ tissue expander Right 02/06/2015  . Breast biopsy Bilateral     "I've had several on both sides"  . Vaginal hysterectomy  1989  . Nipple sparing mastectomy/sentinal lymph node  biopsy/reconstruction/placement of tissue expander Right 02/06/2015    Procedure: NIPPLE SPARING MASTECTOMY WITH SENTINAL LYMPH NODE BIOPSY;  Surgeon: Tricia Klein, MD;   Location: [place];  Service: General;  Laterality: Right;  . Breast reconstruction with placement of tissue expander and flex hd (acellular hydrated dermis) Right 02/06/2015    Procedure:  PLACEMENT OF RIGHT BREAST TISSUE EXPANDER OR POSSIBLE IMPLANT FOR BREAST RECONSTRUCTION;  Surgeon: Tricia Reese, MD;  Location: [place];  Service: Plastics;  Laterality: Right;    FAMILY HISTORY Family History  Problem Relation Age of Onset  . Hyperlipidemia Father   . Hypertension Father   . Kidney failure Maternal Grandmother   . Diabetes Maternal Grandfather   . Stroke Maternal Grandfather   . Lung cancer Paternal Grandmother   . Heart attack Paternal Grandfather   . Heart disease Paternal Grandfather    the patient's parents are still living as of April 2016. The patient has one brother and one sister. There is no cancer in the immediate family. The paternal grandmother died of a "vascular cancer" at age 55. There is no history of breast or ovarian cancer in the family that the patient is aware of  GYNECOLOGIC HISTORY:  No LMP recorded. Patient has had a hysterectomy. Menarche age 3, first live birth age 53. She is GX P2. She underwent a hysterectomy without salpingo-oophorectomy in 1989. She did not take hormone replacement.  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 Her (second) husband, Tricia Thomas is Clinical cytogeneticist store in [place] with the patient lives. Prevacid she works in [place]). The patient's children are Tricia Thomas Lives in [place] and Works As a Marine scientist and [employer] Who Lives in [place] and Is a Dealer. The Patient Has 2 Grandchildren. She Is Not a Ambulance person.    ADVANCED DIRECTIVES: Not in place   HEALTH MAINTENANCE: Social History  Substance Use Topics  . Smoking status: Former Smoker -- 1.00 packs/day for 10 years    Types: Cigarettes    Quit date: 12/02/2014  . Smokeless tobacco: Never Used  .  Alcohol Use: No     Colonoscopy:  PAP:  Bone density:  Lipid panel:  No Known Allergies  Current Outpatient Prescriptions  Medication Sig Dispense Refill  . calcium-vitamin D (OSCAL WITH D) 500-200 MG-UNIT per tablet Take 2 tablets by mouth.    . docusate sodium (COLACE) 100 MG capsule Take 1 capsule (100 mg total) by mouth daily. (Patient not taking: Reported on 03/07/2015) 10 capsule 0  . methocarbamol (ROBAXIN) 500 MG tablet Take 1 tablet (500 mg total) by mouth 4 (four) times daily. (Patient not taking: Reported on 03/07/2015) 40 tablet 1  . Multiple Vitamin (MULTIVITAMIN) tablet Take 1 tablet by mouth daily.    . tamoxifen (NOLVADEX) 20 MG tablet Take 1 tablet (20 mg total) by mouth daily. 90 tablet 12  . varenicline (CHANTIX) 0.5 MG tablet Take 1 tablet (0.5 mg total) by mouth 2 (two) times daily. (Patient not taking: Reported on 03/07/2015) 60 tablet 3   No current facility-administered medications for this visit.    OBJECTIVE: Middle-aged white woman w in no acute distress Filed Vitals:   03/16/15 1406  BP: 114/70  Pulse: 64  Temp: 98.1 F (36.7 C)  Resp: 18     Body mass index is 20.83 kg/(m^2).    ECOG FS:0 - Asymptomatic  Sclerae unicteric, pupils round and equal Oropharynx clear and moist-- no thrush  or other lesions No cervical or supraclavicular adenopathy Lungs no rales or rhonchi Heart regular rate and rhythm Abd soft, nontender, positive bowel sounds MSK no focal spinal tenderness, no upper extremity lymphedema Neuro: nonfocal, well oriented, appropriate affect Breasts: The right breast is status post mastectomy with silicon implant reconstruction. This silhouette is good but the breast is approximately one third smaller than the left. There is no evidence of residual or recurrent disease. The right axilla is benign. The left breast is unremarkable.   LAB RESULTS:  CMP     Component Value Date/Time   NA 144 11/08/2014 1220   K 4.5 11/08/2014 1220   CO2  25 11/08/2014 1220   GLUCOSE 87 11/08/2014 1220   BUN 11.8 11/08/2014 1220   CREATININE 0.9 11/08/2014 1220   CALCIUM 9.3 11/08/2014 1220   PROT 7.1 11/08/2014 1220   ALBUMIN 4.2 11/08/2014 1220   AST 13 11/08/2014 1220   ALT 10 11/08/2014 1220   ALKPHOS 61 11/08/2014 1220   BILITOT 0.82 11/08/2014 1220    INo results found for: SPEP, UPEP  Lab Results  Component Value Date   WBC 7.5 01/31/2015   NEUTROABS 3.3 11/08/2014   HGB 12.4 01/31/2015   HCT 36.9 01/31/2015   MCV 91.1 01/31/2015   PLT 264 01/31/2015      Chemistry      Component Value Date/Time   NA 144 11/08/2014 1220   K 4.5 11/08/2014 1220   CO2 25 11/08/2014 1220   BUN 11.8 11/08/2014 1220   CREATININE 0.9 11/08/2014 1220      Component Value Date/Time   CALCIUM 9.3 11/08/2014 1220   ALKPHOS 61 11/08/2014 1220   AST 13 11/08/2014 1220   ALT 10 11/08/2014 1220   BILITOT 0.82 11/08/2014 1220       No results found for: LABCA2  No components found for: LABCA125  No results for input(s): INR in the last 168 hours.  Urinalysis No results found for: COLORURINE, APPEARANCEUR, LABSPEC, PHURINE, GLUCOSEU, HGBUR, BILIRUBINUR, KETONESUR, PROTEINUR, UROBILINOGEN, NITRITE, LEUKOCYTESUR  STUDIES: No results found.  ASSESSMENT: 49 y.o. Tricia Thomas woman status post right breast biopsy 10/26/2014 for ductal carcinoma in situ, grade 1, strongly estrogen and progesterone receptor positive  (1) status post left mastectomy and sentinel lymph node sampling 02/06/2015 for ductal carcinoma in situ, low-grade, measuring 1.6 cm, with a focally positive anterior margin; both sentinel lymph nodes were clear  (a) margin cannot be cleared surgically (unknown location)  (2) patient discussed adjuvant radiation for margin and assurance but was advised against it because of concerns regarding cosmesis post reconstruction  (3) tamoxifen started 03/16/2015  (4) Continuing tobacco abuse: The patient has been  strongly advised to quit. She has requested Chantix, which she took before without complications. This will be prescribed for her.  PLAN: I spent approximately 50 minutes with the patient and her husband Tricia Thomas going over her situation. She understands she had no invasive disease. She only had noninvasive breast cancer because the cancer cells in these cases are trapped in the ducts where they started, these cancers are not life threatening.  In most cases mastectomy cures ductal carcinoma in situ. Since the cancer cells are trapped in the ducts, when all the doctor removed all the cancer is also removed.  What is different in Tricia Thomas's case is the fact that she had a focally positive margin. I do have several pictures to try to understand what focal versus broad margins involvement she understands her may  be some residual breast cancer on the other side of wherever that margin while's. If there is then she doesn't do anything about it then those cells would eventually grow and that would be a recurrence.  We went over the reasons why further surgery for margin clearance is not going to be possible in her case. She met with Dr. Isidore Thomas and discussed the option of radiation in detail.  Our recommendation is that she start anti-estrogens. Given her age and the fact that she is status post hysterectomy (though she kept her ovaries, which still might be functional) we recommend tamoxifen. We discussed the possible toxicities, side effects and complications including concerns regarding blood clots. We also discussed additional benefits including the fact that tamoxifen taken for 5 years cut in half the risk of developing another breast cancer in the remaining breast (that risk would otherwise be approximately 1/2% per year).  Finally we discussed the option of using vaginal estrogens while on tamoxifen. The patient will be interested in [place]. However I would like to be assured that she will be able to  tolerate tamoxifen before starting focal estrogens. She will see me again in November. If everything is going well we will start Estring at that time and I will also start seeing her on a once a year basis, most likely in April, after her mammography in March.  Lakysha has a good understanding of this plan. She agrees with it. She will call with any problems that may develop before her next visit here.  Tricia Cruel, MD   03/16/2015 2:48 PM Medical Oncology and Hematology [facility] [address] [place], [place] [address] Tel. [phone]    Fax. [phone]

## 2015-03-23 ENCOUNTER — Other Ambulatory Visit: Payer: Self-pay | Admitting: Adult Health

## 2015-03-23 ENCOUNTER — Telehealth: Payer: Self-pay | Admitting: Oncology

## 2015-03-23 DIAGNOSIS — C50511 Malignant neoplasm of lower-outer quadrant of right female breast: Secondary | ICD-10-CM

## 2015-03-23 NOTE — Telephone Encounter (Signed)
lvm for pt regarding to Sept appt.... °

## 2015-04-09 ENCOUNTER — Telehealth: Payer: Self-pay | Admitting: Oncology

## 2015-04-09 NOTE — Telephone Encounter (Signed)
Returned Advertising account executive. Patient canceled survivorship appointment, does not feel she needs appointment. She will see MD in November

## 2015-04-10 ENCOUNTER — Encounter: Payer: BLUE CROSS/BLUE SHIELD | Admitting: Nurse Practitioner

## 2015-04-10 ENCOUNTER — Encounter: Payer: Self-pay | Admitting: Nurse Practitioner

## 2015-04-10 NOTE — Progress Notes (Signed)
The Survivorship Care Plan was mailed to Tricia Thomas as she was unable to come in to the Survivorship Clinic for an in-person visit at this time. A letter was mailed to her outlining the purpose of the content of the care plan, as well as encouraging her to reach out to me with any questions or concerns.  My business card was included in the correspondence to the patient as well.  A copy of the care plan was also routed/faxed/mailed to [facility], MD, the patient's PCP.  I will not be placing any follow-up appointments to the Survivorship Clinic for [name], but I am happy to see her at any time in the future for any survivorship concerns that may arise. Thank you for allowing me to participate in her care!  Kenn File, [place] [phone]

## 2015-05-01 ENCOUNTER — Other Ambulatory Visit: Payer: Self-pay | Admitting: General Surgery

## 2015-05-01 DIAGNOSIS — N6320 Unspecified lump in the left breast, unspecified quadrant: Secondary | ICD-10-CM

## 2015-05-01 DIAGNOSIS — R928 Other abnormal and inconclusive findings on diagnostic imaging of breast: Secondary | ICD-10-CM

## 2015-05-01 DIAGNOSIS — Z853 Personal history of malignant neoplasm of breast: Secondary | ICD-10-CM

## 2015-05-12 ENCOUNTER — Ambulatory Visit
Admission: RE | Admit: 2015-05-12 | Discharge: 2015-05-12 | Disposition: A | Discharge status: Home / Self Care | Payer: BLUE CROSS/BLUE SHIELD | Source: Ambulatory Visit | Attending: General Surgery | Admitting: General Surgery

## 2015-05-12 DIAGNOSIS — R928 Other abnormal and inconclusive findings on diagnostic imaging of breast: Secondary | ICD-10-CM

## 2015-05-12 DIAGNOSIS — N6320 Unspecified lump in the left breast, unspecified quadrant: Secondary | ICD-10-CM

## 2015-05-12 DIAGNOSIS — Z853 Personal history of malignant neoplasm of breast: Secondary | ICD-10-CM

## 2015-05-12 MED ORDER — GADOBENATE DIMEGLUMINE 529 MG/ML IV SOLN
10.0000 mL | Freq: Once | INTRAVENOUS | Status: AC | PRN
  Administered 2015-05-12: 10 mL via INTRAVENOUS

## 2015-06-05 ENCOUNTER — Telehealth: Payer: Self-pay | Admitting: Oncology

## 2015-06-05 NOTE — Telephone Encounter (Signed)
Patient had called in and left a message that she was having surgery 11/15 and needed to cancel and r/s,i have sent dr Jana Hakim a message for a new date

## 2015-06-07 ENCOUNTER — Other Ambulatory Visit: Payer: Self-pay | Admitting: Oncology

## 2015-06-07 ENCOUNTER — Telehealth: Payer: Self-pay | Admitting: Oncology

## 2015-06-07 NOTE — Telephone Encounter (Signed)
Called and left a message with a new appointment per pof °

## 2015-06-12 ENCOUNTER — Ambulatory Visit: Payer: BLUE CROSS/BLUE SHIELD | Admitting: Oncology

## 2015-07-04 ENCOUNTER — Ambulatory Visit (HOSPITAL_BASED_OUTPATIENT_CLINIC_OR_DEPARTMENT_OTHER): Payer: BLUE CROSS/BLUE SHIELD | Admitting: Nurse Practitioner

## 2015-07-04 ENCOUNTER — Telehealth: Payer: Self-pay | Admitting: Oncology

## 2015-07-04 ENCOUNTER — Other Ambulatory Visit: Payer: Self-pay | Admitting: *Deleted

## 2015-07-04 ENCOUNTER — Encounter: Payer: Self-pay | Admitting: Nurse Practitioner

## 2015-07-04 VITALS — BP 111/70 | HR 100 | Temp 98.3°F | Resp 18 | Ht 65.0 in | Wt 118.1 lb

## 2015-07-04 DIAGNOSIS — C50411 Malignant neoplasm of upper-outer quadrant of right female breast: Secondary | ICD-10-CM

## 2015-07-04 DIAGNOSIS — Z720 Tobacco use: Secondary | ICD-10-CM | POA: Diagnosis not present

## 2015-07-04 DIAGNOSIS — C50511 Malignant neoplasm of lower-outer quadrant of right female breast: Secondary | ICD-10-CM

## 2015-07-04 DIAGNOSIS — Z170 Estrogen receptor positive status [ER+]: Secondary | ICD-10-CM

## 2015-07-04 MED ORDER — VARENICLINE TARTRATE 0.5 MG PO TABS: 0.5000 mg | ORAL_TABLET | Freq: Two times a day (BID) | ORAL | Status: DC

## 2015-07-04 NOTE — Progress Notes (Signed)
[place]  Telephone:(336) [phone] Fax:(336) [phone]     ID: Tricia Thomas DOB: 1966-07-10  MR#: [id number]  [id number]  Patient Care Team: Carol Ada, MD as PCP - General (Family Medicine) Stark Klein, MD as Consulting Physician (General Surgery) Chauncey Cruel, MD as Consulting Physician (Oncology) Eppie Gibson, MD as Attending Physician (Radiation Oncology) Mauro Kaufmann, RN as Registered Nurse Rockwell Germany, RN as Registered Nurse Sylvan Cheese, NP as Nurse Practitioner (Nurse Practitioner) PCP: Reginia Naas, MD GYN: OTHER MD: Crissie Reese MD  CHIEF COMPLAINT: Ductal carcinoma in situ  CURRENT TREATMENT: tamoxifen   BREAST CANCER HISTORY: From the original intake note:  Tricia Thomas had her first ever mammography at [facility] 09/03/2013. The breast composition was category C. Heterogeneous calcifications were noted in the left breast and she was recalled for additional views 09/08/2013. Grouped and scattered round calcifications in the left breast posteriorly were felt to be most likely benign, but 6 month follow-up was recommended. This was performed 03/20/2014. This time there was no mammographic evidence of malignancy.  On 10/24/2014 however when the patient had her annual follow-up mammography, in addition to the benign appearing calcifications previously noted, there was a 2.2 cm area of calcifications in the right breast now, upper outer quadrant, which appeared to have increased in number. 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  Her subsequent history is as detailed below   INTERVAL HISTORY: Tricia Thomas returns today for follow-up of her noninvasive breast cancer. At her last visit she was started on tamoxifen. She is tolerating this reasonably well so far. She has no hot flashes,  but does have some vaginal wetness. She has noticed cramping to her feet, calves, and hands since the start of this drug, but can not directly relate it to tamoxifen. It does not occur every day. The interval history is remarkable for a reduction to her left breast. This is healing well with no complications.   REVIEW OF SYSTEMS: She has recently "backslid" and is now smoking again after having quit with the help of chantix. A detailed review of systems was otherwise entirely negative, except where noted above.  PAST MEDICAL HISTORY: Past Medical History  Diagnosis Date  . Syncope     sudden onset  . Dysrhythmia   . H/O sinus tachycardia 2014    evaluated By Cardiologist, Dr Harrington Challenger  . Constipation     takes Miralax  . Breast cancer of lower-outer quadrant of right female breast 11/01/2014  . Breast cancer     PAST SURGICAL HISTORY: Past Surgical History  Procedure Laterality Date  . Appendectomy  1990  . Wisdom tooth extraction    . Mastectomy complete / simple w/ sentinel node biopsy Right 02/06/2015  . Reconstruction breast immediate / delayed w/ tissue expander Right 02/06/2015  . Breast biopsy Bilateral     "I've had several on both sides"  . Vaginal hysterectomy  1989  . Nipple sparing mastectomy/sentinal lymph node biopsy/reconstruction/placement of tissue expander Right 02/06/2015    Procedure: NIPPLE SPARING MASTECTOMY WITH SENTINAL LYMPH NODE BIOPSY;  Surgeon: Stark Klein, MD;  Location: [place];  Service: General;  Laterality: Right;  . Breast reconstruction with placement of tissue expander and flex hd (acellular hydrated dermis) Right 02/06/2015    Procedure:  PLACEMENT OF RIGHT BREAST TISSUE EXPANDER OR POSSIBLE IMPLANT FOR BREAST RECONSTRUCTION;  Surgeon: Shanon Brow  Harlow Mares, MD;  Location: [place];  Service: Plastics;  Laterality: Right;    FAMILY HISTORY Family History  Problem Relation Age of Onset  . Hyperlipidemia Father   . Hypertension Father   . Kidney failure Maternal  Grandmother   . Diabetes Maternal Grandfather   . Stroke Maternal Grandfather   . Lung cancer Paternal Grandmother   . Heart attack Paternal Grandfather   . Heart disease Paternal Grandfather    the patient's parents are still living as of April 2016. The patient has one brother and one sister. There is no cancer in the immediate family. The paternal grandmother died of a "vascular cancer" at age 59. There is no history of breast or ovarian cancer in the family that the patient is aware of  GYNECOLOGIC HISTORY:  No LMP recorded. Patient has had a hysterectomy. Menarche age 49, first live birth age 49. She is GX P2. She underwent a hysterectomy without salpingo-oophorectomy in 1989. She did not take hormone replacement.  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 Her (second) husband, Tricia Thomas is Clinical cytogeneticist store in [place] with the patient lives. Prevacid she works in [place]). The patient's children are Tricia Thomas and Tricia Thomas Lives in [place] and Works As a Marine scientist and [employer] Who Lives in [place] and Is a Dealer. The Patient Has 2 Grandchildren. She Is Not a Ambulance person.    ADVANCED DIRECTIVES: Not in place   HEALTH MAINTENANCE: Social History  Substance Use Topics  . Smoking status: Former Smoker -- 1.00 packs/day for 10 years    Types: Cigarettes    Quit date: 12/02/2014  . Smokeless tobacco: Never Used  . Alcohol Use: No     Colonoscopy:  PAP:  Bone density:  Lipid panel:  No Known Allergies  Current Outpatient Prescriptions  Medication Sig Dispense Refill  . calcium-vitamin D (OSCAL WITH D) 500-200 MG-UNIT per tablet Take 2 tablets by mouth.    . methocarbamol (ROBAXIN) 500 MG tablet Take 1 tablet (500 mg total) by mouth 4 (four) times daily. 40 tablet 1  . Multiple Vitamin (MULTIVITAMIN) tablet Take 1 tablet by mouth daily.    . polyethylene glycol (MIRALAX / GLYCOLAX) packet Take 17 g by mouth  daily as needed for mild constipation.    Marland Kitchen PREVIDENT 5000 SENSITIVE 1.1-5 % PSTE USE ONCE PER DAY AT BEDTIME  0  . tamoxifen (NOLVADEX) 20 MG tablet Take 20 mg by mouth daily.   12  . docusate sodium (COLACE) 100 MG capsule Take 1 capsule (100 mg total) by mouth daily. (Patient not taking: Reported on 03/07/2015) 10 capsule 0  . varenicline (CHANTIX) 0.5 MG tablet Take 1 tablet (0.5 mg total) by mouth 2 (two) times daily. 60 tablet 3   No current facility-administered medications for this visit.    OBJECTIVE: Middle-aged white woman w in no acute distress Filed Vitals:   07/04/15 1322  BP: 111/70  Pulse: 100  Temp: 98.3 F (36.8 C)  Resp: 18     Body mass index is 19.65 kg/(m^2).    ECOG FS:0 - Asymptomatic  Skin: warm, dry  HEENT: sclerae anicteric, conjunctivae pink, oropharynx clear. No thrush or mucositis.  Lymph Nodes: No cervical or supraclavicular lymphadenopathy  Lungs: clear to auscultation bilaterally, no rales, wheezes, or rhonci  Heart: regular rate and rhythm  Abdomen: round, soft, non tender, positive bowel sounds  Musculoskeletal: No focal spinal tenderness, no peripheral edema  Neuro:  non focal, well oriented, positive affect  Breast: right breast status post mastectomy with implant reconstruction. Right axilla axilla benign. Left breast status post recent reduction. Otherwise unremarkable.   LAB RESULTS:  CMP     Component Value Date/Time   NA 144 11/08/2014 1220   K 4.5 11/08/2014 1220   CO2 25 11/08/2014 1220   GLUCOSE 87 11/08/2014 1220   BUN 11.8 11/08/2014 1220   CREATININE 0.9 11/08/2014 1220   CALCIUM 9.3 11/08/2014 1220   PROT 7.1 11/08/2014 1220   ALBUMIN 4.2 11/08/2014 1220   AST 13 11/08/2014 1220   ALT 10 11/08/2014 1220   ALKPHOS 61 11/08/2014 1220   BILITOT 0.82 11/08/2014 1220    INo results found for: SPEP, UPEP  Lab Results  Component Value Date   WBC 7.5 01/31/2015   NEUTROABS 3.3 11/08/2014   HGB 12.4 01/31/2015   HCT 36.9  01/31/2015   MCV 91.1 01/31/2015   PLT 264 01/31/2015      Chemistry      Component Value Date/Time   NA 144 11/08/2014 1220   K 4.5 11/08/2014 1220   CO2 25 11/08/2014 1220   BUN 11.8 11/08/2014 1220   CREATININE 0.9 11/08/2014 1220      Component Value Date/Time   CALCIUM 9.3 11/08/2014 1220   ALKPHOS 61 11/08/2014 1220   AST 13 11/08/2014 1220   ALT 10 11/08/2014 1220   BILITOT 0.82 11/08/2014 1220       No results found for: LABCA2  No components found for: LABCA125  No results for input(s): INR in the last 168 hours.  Urinalysis No results found for: COLORURINE, APPEARANCEUR, LABSPEC, PHURINE, GLUCOSEU, HGBUR, BILIRUBINUR, KETONESUR, PROTEINUR, UROBILINOGEN, NITRITE, LEUKOCYTESUR  STUDIES: No results found.  ASSESSMENT: 49 y.o. [place] woman status post right breast biopsy 10/26/2014 for ductal carcinoma in situ, grade 1, strongly estrogen and progesterone receptor positive  (1) status post left mastectomy and sentinel lymph node sampling 02/06/2015 for ductal carcinoma in situ, low-grade, measuring 1.6 cm, with a focally positive anterior margin; both sentinel lymph nodes were clear  (a) margin cannot be cleared surgically (unknown location)  (2) patient discussed adjuvant radiation for margin and assurance but was advised against it because of concerns regarding cosmesis post reconstruction  (3) tamoxifen started 03/16/2015  (4) Continuing tobacco abuse: The patient has been strongly advised to quit. She has requested Chantix, which she took before without complications. This will be prescribed for her.  PLAN: Nigel looks and feels well today. She is recovering well from surgery. She is tolerating the tamoxifen well and will continue for at least 5 years of antiestrogen therapy on this drug. She will wear panty liners if necessary for her vaginal wetness.   She has requested a refill on chanix in an effort to quit smoking again. She understands  that like tamoxifen, smoking also increases her risk of blood clots.   Rosenda is due for a repeat left mammogram in May. She will return for a follow up visit in June. She understands and agrees with this plan. She knows the goal of treatment in her case is cure. She has been encouraged to call with any issues that might arise before her next visit here.    Laurie Panda, NP   07/04/2015 1:52 PM

## 2015-07-04 NOTE — Telephone Encounter (Signed)
GAVE PATIENT AVS REPORT AND APPOINTMENTS FOR June 2017.

## 2015-07-26 ENCOUNTER — Encounter: Payer: Self-pay | Admitting: Adult Health

## 2015-07-26 NOTE — Progress Notes (Signed)
A birthday card was mailed to the patient today on behalf of the Survivorship Program at [facility].   Gretchen Dawson, NP Survivorship Program [facility] [phone]  

## 2015-09-25 ENCOUNTER — Other Ambulatory Visit: Payer: Self-pay | Admitting: Plastic Surgery

## 2016-01-02 ENCOUNTER — Ambulatory Visit: Payer: BLUE CROSS/BLUE SHIELD | Admitting: Oncology

## 2016-03-25 ENCOUNTER — Telehealth: Payer: Self-pay | Admitting: Oncology

## 2016-03-25 NOTE — Telephone Encounter (Signed)
lvm to inform pt of 9/28 appt per LOS

## 2016-04-23 ENCOUNTER — Other Ambulatory Visit: Payer: Self-pay | Admitting: *Deleted

## 2016-04-23 DIAGNOSIS — C50511 Malignant neoplasm of lower-outer quadrant of right female breast: Secondary | ICD-10-CM

## 2016-04-24 ENCOUNTER — Other Ambulatory Visit (HOSPITAL_BASED_OUTPATIENT_CLINIC_OR_DEPARTMENT_OTHER): Payer: BLUE CROSS/BLUE SHIELD

## 2016-04-24 ENCOUNTER — Ambulatory Visit (HOSPITAL_BASED_OUTPATIENT_CLINIC_OR_DEPARTMENT_OTHER): Payer: BLUE CROSS/BLUE SHIELD | Admitting: Oncology

## 2016-04-24 VITALS — BP 118/80 | HR 74 | Temp 98.5°F | Resp 18 | Ht 65.0 in | Wt 123.5 lb

## 2016-04-24 DIAGNOSIS — R05 Cough: Secondary | ICD-10-CM | POA: Diagnosis not present

## 2016-04-24 DIAGNOSIS — G4762 Sleep related leg cramps: Secondary | ICD-10-CM

## 2016-04-24 DIAGNOSIS — C50511 Malignant neoplasm of lower-outer quadrant of right female breast: Secondary | ICD-10-CM

## 2016-04-24 DIAGNOSIS — Z720 Tobacco use: Secondary | ICD-10-CM | POA: Diagnosis not present

## 2016-04-24 DIAGNOSIS — D0511 Intraductal carcinoma in situ of right breast: Secondary | ICD-10-CM

## 2016-04-24 LAB — COMPREHENSIVE METABOLIC PANEL
ALT: 14 U/L (ref 0–55)
ANION GAP: 10 meq/L (ref 3–11)
AST: 15 U/L (ref 5–34)
Albumin: 3.8 g/dL (ref 3.5–5.0)
Alkaline Phosphatase: 48 U/L (ref 40–150)
BUN: 6.1 mg/dL — ABNORMAL LOW (ref 7.0–26.0)
CHLORIDE: 109 meq/L (ref 98–109)
CO2: 24 mEq/L (ref 22–29)
Calcium: 9.3 mg/dL (ref 8.4–10.4)
Creatinine: 0.9 mg/dL (ref 0.6–1.1)
EGFR: 78 mL/min/{1.73_m2} — AB (ref >90)
GLUCOSE: 84 mg/dL (ref 70–140)
Potassium: 4.4 mEq/L (ref 3.5–5.1)
SODIUM: 143 meq/L (ref 136–145)
Total Bilirubin: 0.44 mg/dL (ref 0.20–1.20)
Total Protein: 6.7 g/dL (ref 6.4–8.3)

## 2016-04-24 LAB — CBC WITH DIFFERENTIAL/PLATELET
BASO%: 1 % (ref 0.0–2.0)
Basophils Absolute: 0.1 10*3/uL (ref 0.0–0.1)
EOS%: 1.8 % (ref 0.0–7.0)
Eosinophils Absolute: 0.2 10*3/uL (ref 0.0–0.5)
HCT: 38.6 % (ref 34.8–46.6)
HGB: 13.1 g/dL (ref 11.6–15.9)
LYMPH%: 29.7 % (ref 14.0–49.7)
MCH: 30.7 pg (ref 25.1–34.0)
MCHC: 33.9 g/dL (ref 31.5–36.0)
MCV: 90.4 fL (ref 79.5–101.0)
MONO#: 0.6 10*3/uL (ref 0.1–0.9)
MONO%: 7.4 % (ref 0.0–14.0)
NEUT%: 60.1 % (ref 38.4–76.8)
NEUTROS ABS: 4.9 10*3/uL (ref 1.5–6.5)
Platelets: 244 10*3/uL (ref 145–400)
RBC: 4.27 10*6/uL (ref 3.70–5.45)
RDW: 13 % (ref 11.2–14.5)
WBC: 8.2 10*3/uL (ref 3.9–10.3)
lymph#: 2.4 10*3/uL (ref 0.9–3.3)

## 2016-04-24 MED ORDER — TAMOXIFEN CITRATE 20 MG PO TABS: 20.0000 mg | ORAL_TABLET | Freq: Every day | ORAL | 12 refills | Status: DC

## 2016-04-24 MED ORDER — VARENICLINE TARTRATE 0.5 MG PO TABS: 0.5000 mg | ORAL_TABLET | Freq: Two times a day (BID) | ORAL | 3 refills | Status: DC

## 2016-04-24 NOTE — Progress Notes (Signed)
[place]  Telephone:(336) [phone] Fax:(336) [phone]     ID: Tricia Thomas DOB: 08-09-1965  MR#: [id number]  [id number]  Patient Care Team: Carol Ada, MD as PCP - General (Family Medicine) Stark Klein, MD as Consulting Physician (General Surgery) Chauncey Cruel, MD as Consulting Physician (Oncology) Eppie Gibson, MD as Attending Physician (Radiation Oncology) Mauro Kaufmann, RN as Registered Nurse Rockwell Germany, RN as Registered Nurse Sylvan Cheese, NP as Nurse Practitioner (Nurse Practitioner) PCP: Reginia Naas, MD GYN: OTHER MD: Crissie Reese MD  CHIEF COMPLAINT: Ductal carcinoma in situ  CURRENT TREATMENT: tamoxifen   BREAST CANCER HISTORY: From the original intake note:  Tricia Thomas had her first ever mammography at [facility] 09/03/2013. The breast composition was category C. Heterogeneous calcifications were noted in the left breast and she was recalled for additional views 09/08/2013. Grouped and scattered round calcifications in the left breast posteriorly were felt to be most likely benign, but 6 month follow-up was recommended. This was performed 03/20/2014. This time there was no mammographic evidence of malignancy.  On 10/24/2014 however when the patient had her annual follow-up mammography, in addition to the benign appearing calcifications previously noted, there was a 2.2 cm area of calcifications in the right breast now, upper outer quadrant, which appeared to have increased in number. 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  Her subsequent history is as detailed below  INTERVAL HISTORY: Tricia Thomas returns today for 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 She continues on tamoxifen, generally with good tolerance. Hot flashes and vaginal discharge are not major problems. She  obtains a drug at no cost at present  REVIEW OF SYSTEMS: She has had some leg cramps which probably are related to the tamoxifen but these are becoming a little less frequent as usually occurs. She feels tired. She has sinus problems. She keeps it cough that is occasionally productive and fortunately she is back to smoking. She feels a little forgetful, but not stressed, depressed, or anxious. She is not exercising regularly. A detailed review of systems today was otherwise stable   PAST MEDICAL HISTORY: Past Medical History:  Diagnosis Date  . Breast cancer ([place])   . Breast cancer of lower-outer quadrant of right female breast ([place]) 11/01/2014  . Constipation    takes Miralax  . Dysrhythmia   . H/O sinus tachycardia 2014   evaluated By Cardiologist, Dr Harrington Challenger  . Syncope    sudden onset    PAST SURGICAL HISTORY: Past Surgical History:  Procedure Laterality Date  . APPENDECTOMY  1990  . BREAST BIOPSY Bilateral    "I've had several on both sides"  . BREAST RECONSTRUCTION WITH PLACEMENT OF TISSUE EXPANDER AND FLEX HD (ACELLULAR HYDRATED DERMIS) Right 02/06/2015   Procedure:  PLACEMENT OF RIGHT BREAST TISSUE EXPANDER OR POSSIBLE IMPLANT FOR BREAST RECONSTRUCTION;  Surgeon: Crissie Reese, MD;  Location: [place];  Service: Plastics;  Laterality: Right;  . MASTECTOMY COMPLETE / SIMPLE W/ SENTINEL NODE BIOPSY Right 02/06/2015  . NIPPLE SPARING MASTECTOMY/SENTINAL LYMPH NODE BIOPSY/RECONSTRUCTION/PLACEMENT OF TISSUE EXPANDER Right 02/06/2015   Procedure: NIPPLE SPARING MASTECTOMY WITH SENTINAL LYMPH NODE BIOPSY;  Surgeon: Stark Klein, MD;  Location: [place];  Service: General;  Laterality: Right;  . RECONSTRUCTION BREAST IMMEDIATE / DELAYED W/ TISSUE EXPANDER Right 02/06/2015  . VAGINAL HYSTERECTOMY  1989  . WISDOM TOOTH EXTRACTION  FAMILY HISTORY Family History  Problem Relation Age of Onset  . Hyperlipidemia Father   . Hypertension Father   . Kidney failure Maternal Grandmother   . Diabetes  Maternal Grandfather   . Stroke Maternal Grandfather   . Lung cancer Paternal Grandmother   . Heart attack Paternal Grandfather   . Heart disease Paternal Grandfather    the patient's parents are still living as of April 2016. The patient has one brother and one sister. There is no cancer in the immediate family. The paternal grandmother died of a "vascular cancer" at age 80. There is no history of breast or ovarian cancer in the family that the patient is aware of  GYNECOLOGIC HISTORY:  No LMP recorded. Patient has had a hysterectomy. Menarche age 48, first live birth age 13. She is GX P2. She underwent a hysterectomy without salpingo-oophorectomy in 1989. She did not take hormone replacement.  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 Her (second) husband, Tricia Thomas is Clinical cytogeneticist store in [place] with the patient lives. Prevacid she works in [place]). The patient's children are [place] Lives in [place] and Works As a Marine scientist and [employer] Who Lives in [place] and Is a Dealer. The Patient Has 2 Grandchildren. She Is Not a Ambulance person.    ADVANCED DIRECTIVES: Not in place   HEALTH MAINTENANCE: Social History  Substance Use Topics  . Smoking status: Former Smoker    Packs/day: 1.00    Years: 10.00    Types: Cigarettes    Quit date: 12/02/2014  . Smokeless tobacco: Never Used  . Alcohol use No     Colonoscopy:  PAP:  Bone density:  Lipid panel:  No Known Allergies  Current Outpatient Prescriptions  Medication Sig Dispense Refill  . calcium-vitamin D (OSCAL WITH D) 500-200 MG-UNIT per tablet Take 2 tablets by mouth.    . docusate sodium (COLACE) 100 MG capsule Take 1 capsule (100 mg total) by mouth daily. (Patient not taking: Reported on 03/07/2015) 10 capsule 0  . Multiple Vitamin (MULTIVITAMIN) tablet Take 1 tablet by mouth daily.    . polyethylene glycol (MIRALAX / GLYCOLAX) packet Take 17  g by mouth daily as needed for mild constipation.    Marland Kitchen PREVIDENT 5000 SENSITIVE 1.1-5 % PSTE USE ONCE PER DAY AT BEDTIME  0  . tamoxifen (NOLVADEX) 20 MG tablet Take 1 tablet (20 mg total) by mouth daily. 90 tablet 12  . varenicline (CHANTIX) 0.5 MG tablet Take 1 tablet (0.5 mg total) by mouth 2 (two) times daily. 60 tablet 3   No current facility-administered medications for this visit.     OBJECTIVE: Middle-aged white woman Who appears well Vitals:   04/24/16 1150  BP: 118/80  Pulse: 74  Resp: 18  Temp: 98.5 F (36.9 C)     Body mass index is 20.55 kg/m.    ECOG FS:1 - Symptomatic but completely ambulatory  Sclerae unicteric, pupils round and equal Oropharynx clear and moist-- no thrush or other lesions No cervical or supraclavicular adenopathy Lungs no rales or rhonchi; coughed during exam Heart regular rate and rhythm Abd soft, nontender, positive bowel sounds MSK no focal spinal tenderness, no upper extremity lymphedema Neuro: nonfocal, well oriented, appropriate affect Breasts: The right breast is status post mastectomy with reconstruction; there is no evidence of local recurrence; the right axilla is benign. The left breast is unremarkable    LAB RESULTS:  CMP  Component Value Date/Time   NA 143 04/24/2016 1129   K 4.4 04/24/2016 1129   CO2 24 04/24/2016 1129   GLUCOSE 84 04/24/2016 1129   BUN 6.1 (L) 04/24/2016 1129   CREATININE 0.9 04/24/2016 1129   CALCIUM 9.3 04/24/2016 1129   PROT 6.7 04/24/2016 1129   ALBUMIN 3.8 04/24/2016 1129   AST 15 04/24/2016 1129   ALT 14 04/24/2016 1129   ALKPHOS 48 04/24/2016 1129   BILITOT 0.44 04/24/2016 1129    INo results found for: SPEP, UPEP  Lab Results  Component Value Date   WBC 8.2 04/24/2016   NEUTROABS 4.9 04/24/2016   HGB 13.1 04/24/2016   HCT 38.6 04/24/2016   MCV 90.4 04/24/2016   PLT 244 04/24/2016      Chemistry      Component Value Date/Time   NA 143 04/24/2016 1129   K 4.4 04/24/2016 1129     CO2 24 04/24/2016 1129   BUN 6.1 (L) 04/24/2016 1129   CREATININE 0.9 04/24/2016 1129      Component Value Date/Time   CALCIUM 9.3 04/24/2016 1129   ALKPHOS 48 04/24/2016 1129   AST 15 04/24/2016 1129   ALT 14 04/24/2016 1129   BILITOT 0.44 04/24/2016 1129       No results found for: LABCA2  No components found for: [id number]  No results for input(s): INR in the last 168 hours.  Urinalysis No results found for: COLORURINE, APPEARANCEUR, LABSPEC, PHURINE, GLUCOSEU, HGBUR, BILIRUBINUR, KETONESUR, PROTEINUR, UROBILINOGEN, NITRITE, LEUKOCYTESUR  STUDIES: No results found.  ASSESSMENT: 50 y.o. [place] woman status post right breast lower outer quadrant biopsy 10/26/2014 for ductal carcinoma in situ, grade 1, strongly estrogen and progesterone receptor positive  (1) status post right mastectomy and sentinel lymph node sampling 02/06/2015 for ductal carcinoma in situ, low-grade, measuring 1.6 cm, with a focally positive anterior margin; both sentinel lymph nodes were clear  (a) margin cannot be cleared surgically (unknown location)  (2) patient discussed adjuvant radiation for margin and assurance but was advised against it because of concerns regarding cosmesis post reconstruction  (3) tamoxifen started 03/16/2015  (4) Continuing tobacco abuse: On Chantix  PLAN: Adyline is tolerating tamoxifen well and the plan is to continue that for a total of 5 years.  I do believe her leg cramps are probably related to the tamoxifen. These usually fade after a few months. I think the cramps she is having at the base of her right thumb sometimes are more likely to be arthritis related.  We again discussed smoking cessation. I was glad to refill her Chantix today. If she starts an exercise program that we'll be a moderate her since it will be hard for her to exercise to any degree while continuing to smoke. This taking a few deep breaths today during the exam may her cough  She  will see our survivorship nurse practitioner in a year, after her next mammogram, and then she will see me again to years from now. I explained that we do not usually draw labs on noninvasive cases and she is comfortable with that  She knows to call for any problems that may develop before her next visit here.   Chauncey Cruel, MD   04/24/2016 1:16 PM

## 2016-07-12 IMAGING — MR MR BREAST BILAT WO/W CM
6 of 13 series · 23 of 48 positions shown · IV contrast (multihance)
Comparison: Breast MRI dated 11/10/2014 and a left breast biopsy
performed 11/27/2014

CLINICAL DATA: 49-year-old patient for six-month follow-up breast
MRI. Ms. Pascale underwent nipple sparing right mastectomy with
sentinel node biopsy 02/06/2015 for right breast ductal carcinoma in
situ.
TECHNIQUE: Multiplanar, multisequence MR images of both breasts were obtained
prior to and following the intravenous administration of 10 ml of
MultiHance.

[Series 2: T2 · axial · 3.0mm · 0.47mm/px · z∈[-82,+80]mm · 3 of 55 slices shown]
[im 1/55]
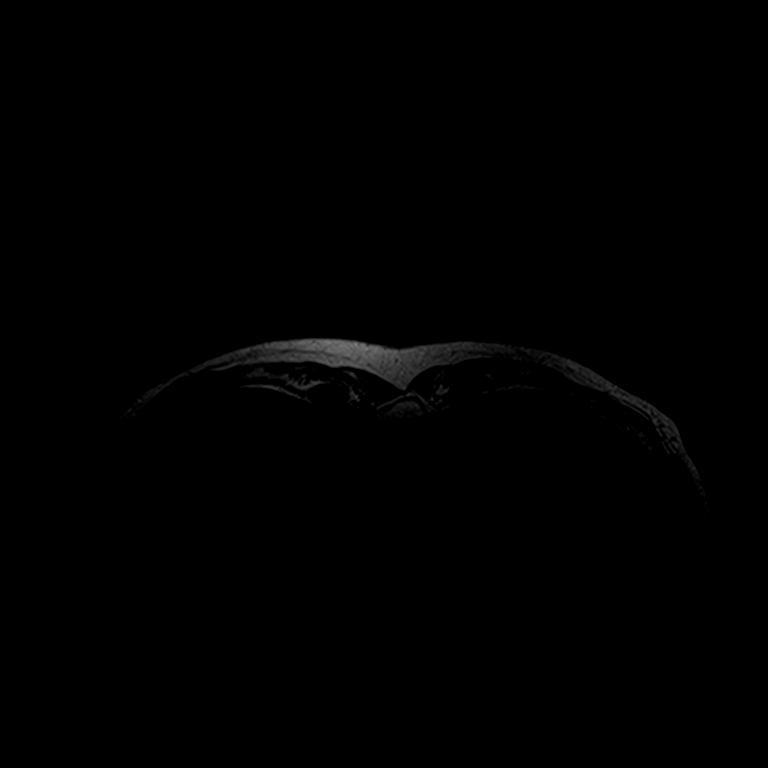
[im 28/55]
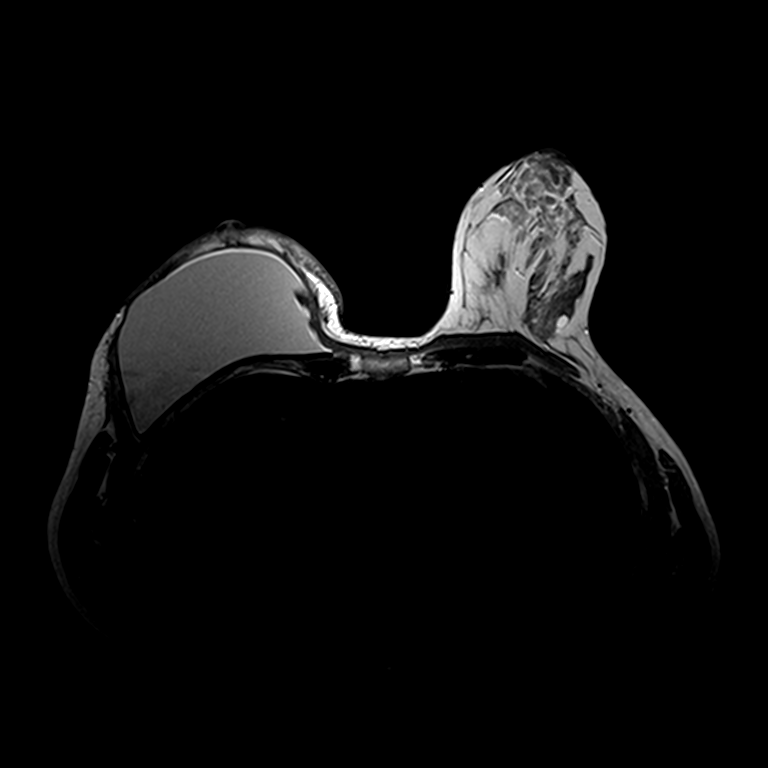
[im 55/55]
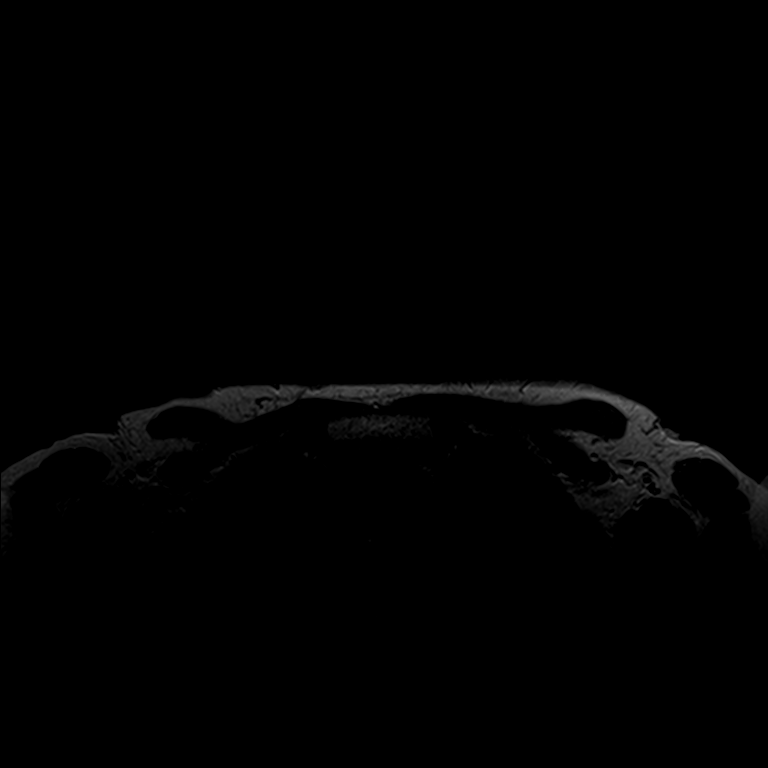

[Series 3: t2_tirm_tra ipat (a-p) · axial · 3.0mm · 0.70mm/px · z∈[-75,+87]mm · 2 of 55 slices shown]
[im 1/55]
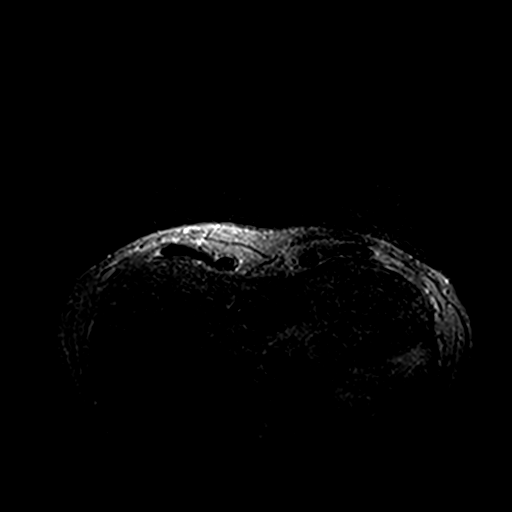
[im 55/55]
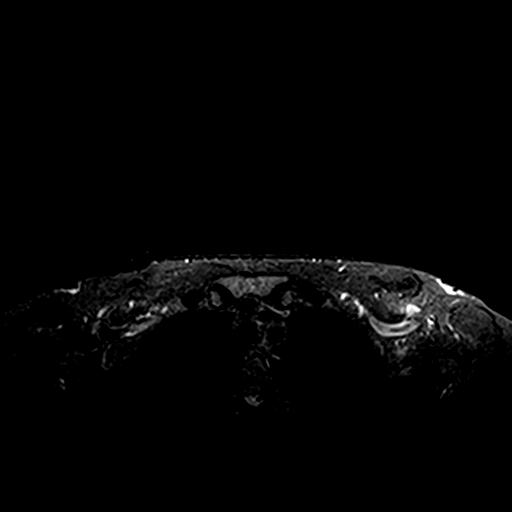

[Series 4: fl3d pre-cm no · axial · non-contrast · 1.2mm · 0.94mm/px · z∈[-78,+94]mm · 5 of 144 slices shown]
[im 1/144]
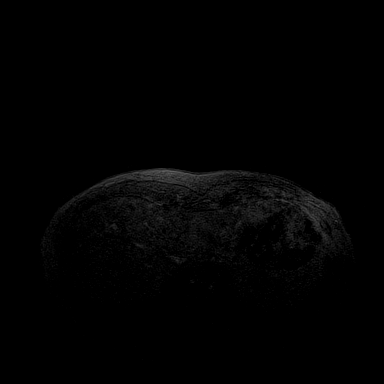
[im 36/144]
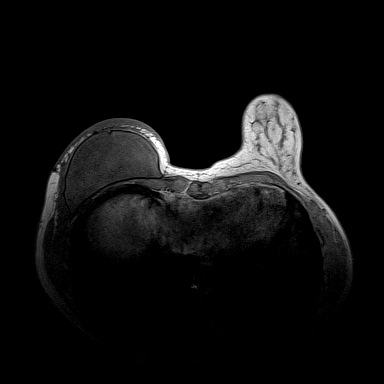
[im 72/144]
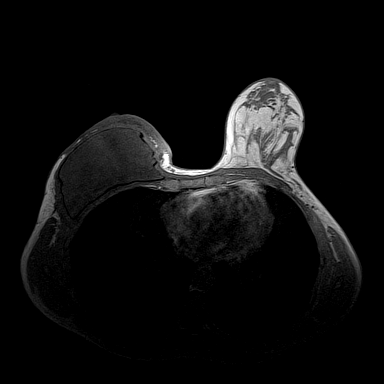
[im 108/144]
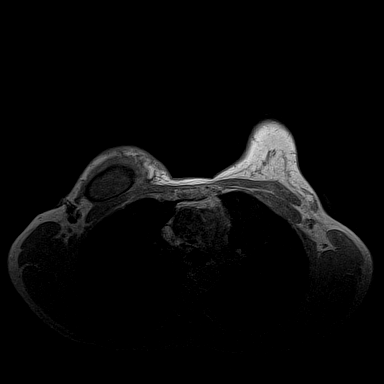
[im 144/144]
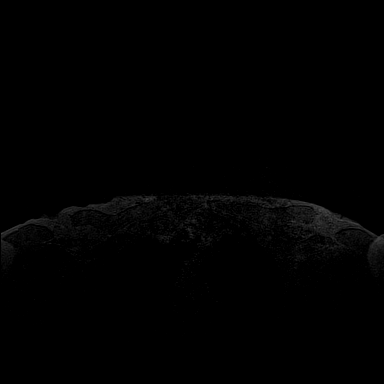

[Series 5: fl3d pre-cm · axial · non-contrast · 1.2mm · 0.94mm/px · z∈[-78,+94]mm · 5 of 144 slices shown]
[im 1/144]
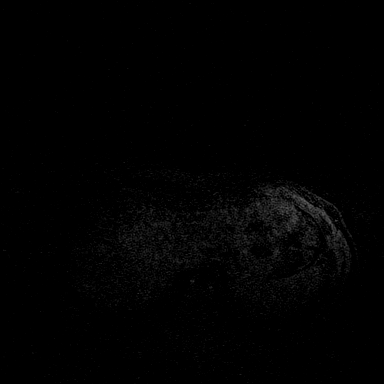
[im 36/144]
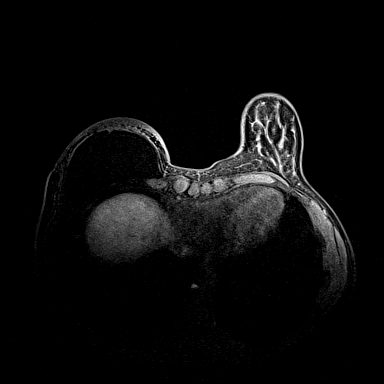
[im 72/144]
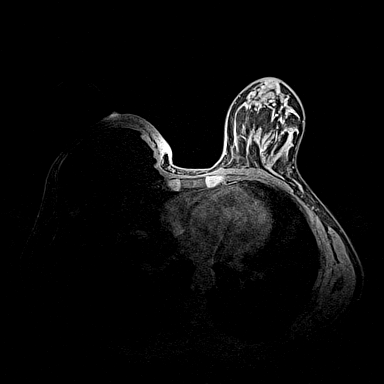
[im 108/144]
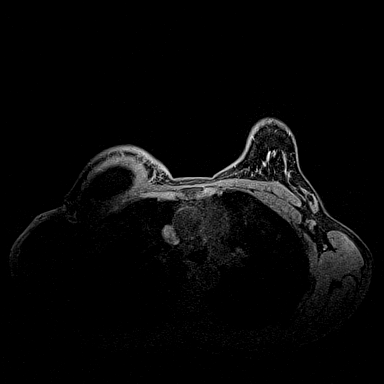
[im 144/144]
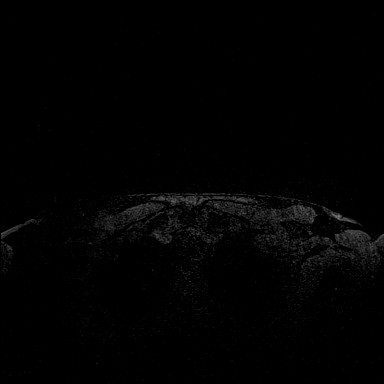

[Series 6: fl3d post immediate · axial · 1.2mm · 0.94mm/px · z∈[-78,+94]mm · 5 of 144 slices shown (1 of 2)]
[im 1/144]
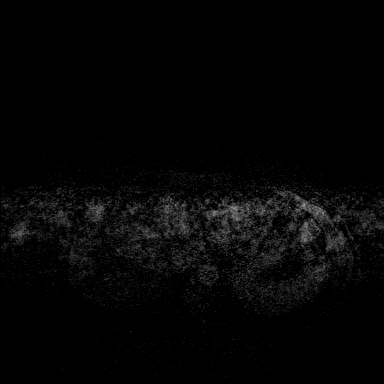
[im 36/144]
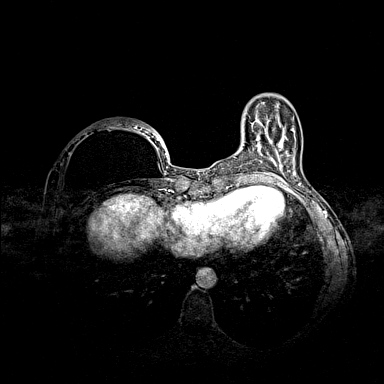
[im 72/144]
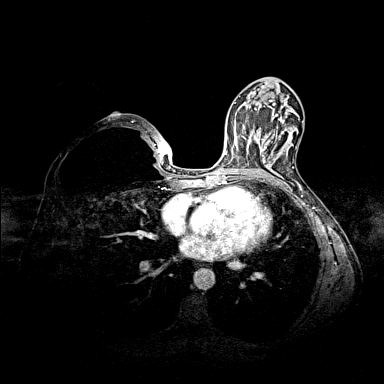
[im 108/144]
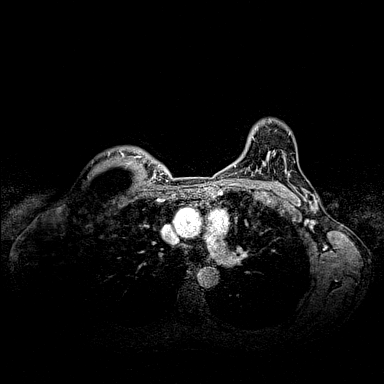
[im 144/144]
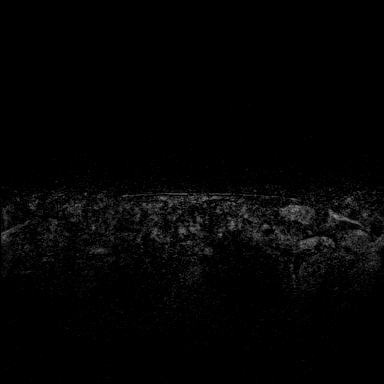

[Series 7: fl3d post immediate · axial · 1.2mm · 0.94mm/px · z∈[-78,+7]mm · 3 of 144 slices shown (2 of 2)]
[im 1/144]
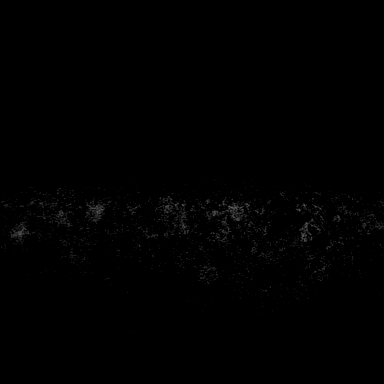
[im 36/144]
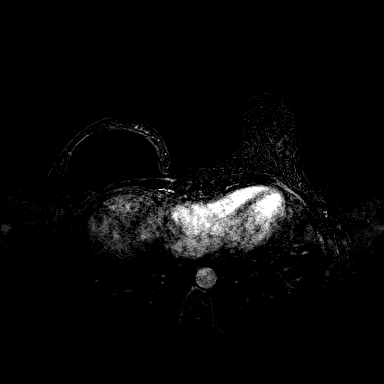
[im 72/144]
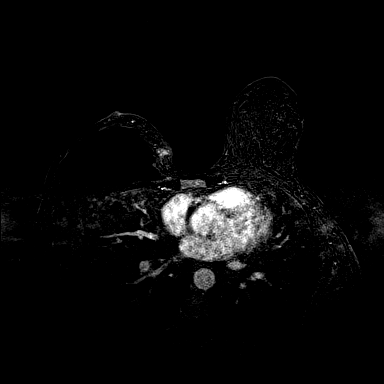

[23 of 48 positions shown; findings below may reference images not displayed]

Six month follow-up breast MRI was recommended following a
contralateral (left) breast MRI-guided biopsy of a 6 mm mass in the
posterior upper outer left breast. This mass was biopsied using MRI
guidance on [DATE]. Pathology revealed fibrocystic changes with
adenosis and calcifications, and usual ductal hyperplasia.

LABS:  None performed

EXAM:
BILATERAL BREAST MRI WITH AND WITHOUT CONTRAST
THREE-DIMENSIONAL MR IMAGE RENDERING ON INDEPENDENT WORKSTATION:

Three-dimensional MR images were rendered by post-processing of the
original MR data on an independent workstation. The
three-dimensional MR images were interpreted, and findings are
reported in the following complete MRI report for this study. Three
dimensional images were evaluated at the independent [employer]
workstation
FINDINGS: Breast composition: b. Scattered fibroglandular tissue.

Background parenchymal enhancement: Mild

Right breast: Postsurgical changes of nipple sparing mastectomy with
implant reconstruction. No mass or abnormal enhancement is
identified.

Left breast: No mass or abnormal enhancement is identified. Biopsy
clip artifact is seen far posteriorly in the upper outer left
breast. Currently, there is no mass in the posterior upper outer
quadrant, in the region of previous MRI-guided biopsy. There are no
suspicious findings.

Lymph nodes: No abnormal appearing lymph nodes.

Ancillary findings: A few sub-cm circumscribed T2 bright areas in
the liver appear unchanged from prior breast MRI and are most likely
benign hepatic cysts.
IMPRESSION: No MRI evidence of malignancy in either breast. Postoperative
changes of nipple sparing mastectomy with implant reconstruction on
the right. Biopsy clip in the posterior upper outer left breast, at
the site of the previous MRI guided biopsy, with no suspicious
findings seen on today's breast MRI of the left breast.

RECOMMENDATION:
Annual screening mammography is recommended.

BI-RADS CATEGORY  2: Benign.

## 2017-04-01 ENCOUNTER — Encounter: Payer: Self-pay | Admitting: Adult Health

## 2017-04-01 ENCOUNTER — Ambulatory Visit (HOSPITAL_BASED_OUTPATIENT_CLINIC_OR_DEPARTMENT_OTHER): Payer: BLUE CROSS/BLUE SHIELD | Admitting: Adult Health

## 2017-04-01 VITALS — BP 111/59 | HR 73 | Temp 97.8°F | Resp 17 | Ht 65.0 in | Wt 131.4 lb

## 2017-04-01 DIAGNOSIS — D0511 Intraductal carcinoma in situ of right breast: Secondary | ICD-10-CM | POA: Diagnosis not present

## 2017-04-01 DIAGNOSIS — C50511 Malignant neoplasm of lower-outer quadrant of right female breast: Secondary | ICD-10-CM

## 2017-04-01 DIAGNOSIS — Z170 Estrogen receptor positive status [ER+]: Secondary | ICD-10-CM | POA: Diagnosis not present

## 2017-04-01 DIAGNOSIS — N898 Other specified noninflammatory disorders of vagina: Secondary | ICD-10-CM

## 2017-04-01 NOTE — Pre-Procedure Instructions (Signed)
[id number]

## 2017-04-01 NOTE — Progress Notes (Signed)
CLINIC:  Survivorship   REASON FOR VISIT:  Routine follow-up for history of breast cancer.   BRIEF ONCOLOGIC HISTORY:    Breast cancer of lower-outer quadrant of right female breast ([place])   09/03/2013 Mammogram    Left breast: grouped, heterogenous calcifications at posterior depth medial region. No findings in right breast.      09/08/2013 Breast US    Left breast: grouped, scattered punctate round calcifications in the left breast, posterior depth, medial region.  No other masses appreciated.  Likely benign. Recommend 6 month follow up.       03/20/2014 Breast US    Left breast: benign appearing scattered calcifications noted in left breast, appear unchanged.  Recommend 6 month follow up at time of bilateral mammogram.      10/24/2014 Breast US    Right breast: 2.2 cm segmental heterogenous calcifications at 8:00, middle depth. Recommend biopsy.  Stable grouped scattered punctate round calcifications in the left breast, posterior depth, medial region.      10/26/2014 Initial Biopsy    [facility] breast needle core biopsy: DCIS with calcifications, low grade, ER+ (100%), PR+ (100%).      11/10/2014 Breast MRI    Right breast: Suspicious NME extending from the bx cavity anterior lateral margin concerning for associated disease. Nonspecific enhancing mass within the central to slightly superior right breast. Sl. irregular enhancing mass within left breast.      11/24/2014 Procedure    MRI guided biopsies of right upper inner (fibrocystic changes, fibroadenoma) and lower outer (intraductal papilloma with ADH) breast lesions. No malignancy noted.      11/27/2014 Procedure    MRI guided biopsy of left breast: fibrocystic changes, ductal hyperplasia, no evidence of malignancy      11/27/2014 Clinical Stage    Stage 0: Tis N0      02/06/2015 Definitive Surgery    Right mastectomy with implant recons: DCIS with calcifications, low grade, 1.6 cm, scattered ADH, focally positive anterior  margin, 2 right axillary lymph nodes removed, negative (0/2). Soft tissue bx of rt axilla: fibrocystic changes / ductal hyperplasia      02/06/2015 Pathologic Stage    Stage 0: pTis pN0       Radiation Therapy    Not recommended due to concerns about cosmesis      03/16/2015 -  Anti-estrogen oral therapy    Tamoxifen 20 mg daily (Magrinat). Planned duration of therapy: 5 years.      04/10/2015 Survivorship    Survivorship care plan mailed to patient as she was unable to come for in-person visit at this time.        INTERVAL HISTORY:  Tricia Thomas presents to the Survivorship Clinic today for routine follow-up for her history of breast cancer.  Overall, she reports feeling quite well.  She just had reconstruction surgery and is taking dilaudid PRN for pain.  She is only requiring one per day.  She sees her PCP regularly.  She does not exercises.  She continues to take Tamoxifen daily and is tolerating it well.  She has underwent TAH several years prior.  She does have an occasional vaginal odor that is sometimes bothersome.     REVIEW OF SYSTEMS:  Review of Systems  Constitutional: Negative for appetite change, chills, fatigue, fever and unexpected weight change.  HENT:   Negative for hearing loss and lump/mass.   Eyes: Negative for eye problems and icterus.  Respiratory: Negative for chest tightness, cough and shortness of breath.  Cardiovascular: Negative for chest pain, leg swelling and palpitations.  Gastrointestinal: Negative for abdominal distention, abdominal pain, constipation, diarrhea, nausea and vomiting.  Endocrine: Negative for hot flashes.  Genitourinary: Negative for menstrual problem, pelvic pain, vaginal bleeding and vaginal discharge.   Musculoskeletal: Negative for arthralgias.  Skin: Negative for itching and rash.  Neurological: Negative for dizziness and headaches.  Hematological: Negative for adenopathy. Does not bruise/bleed easily.  Psychiatric/Behavioral:  Negative for depression. The patient is not nervous/anxious.   Breast: Denies any new nodularity, masses, tenderness, nipple changes, or nipple discharge.       PAST MEDICAL/SURGICAL HISTORY:  Past Medical History:  Diagnosis Date  . Breast cancer ([place])   . Breast cancer of lower-outer quadrant of right female breast ([place]) 11/01/2014  . Constipation    takes Miralax  . Dysrhythmia   . H/O sinus tachycardia 2014   evaluated By Cardiologist, Dr Harrington Challenger  . Syncope    sudden onset   Past Surgical History:  Procedure Laterality Date  . APPENDECTOMY  1990  . BREAST BIOPSY Bilateral    "I've had several on both sides"  . BREAST RECONSTRUCTION WITH PLACEMENT OF TISSUE EXPANDER AND FLEX HD (ACELLULAR HYDRATED DERMIS) Right 02/06/2015   Procedure:  PLACEMENT OF RIGHT BREAST TISSUE EXPANDER OR POSSIBLE IMPLANT FOR BREAST RECONSTRUCTION;  Surgeon: Crissie Reese, MD;  Location: [facility];  Service: Plastics;  Laterality: Right;  . MASTECTOMY COMPLETE / SIMPLE W/ SENTINEL NODE BIOPSY Right 02/06/2015  . NIPPLE SPARING MASTECTOMY/SENTINAL LYMPH NODE BIOPSY/RECONSTRUCTION/PLACEMENT OF TISSUE EXPANDER Right 02/06/2015   Procedure: NIPPLE SPARING MASTECTOMY WITH SENTINAL LYMPH NODE BIOPSY;  Surgeon: Stark Klein, MD;  Location: [facility];  Service: General;  Laterality: Right;  . RECONSTRUCTION BREAST IMMEDIATE / DELAYED W/ TISSUE EXPANDER Right 02/06/2015  . VAGINAL HYSTERECTOMY  1989  . WISDOM TOOTH EXTRACTION       ALLERGIES:  No Known Allergies   CURRENT MEDICATIONS:  Outpatient Encounter Prescriptions as of 04/01/2017  Medication Sig Note  . calcium-vitamin D (OSCAL WITH D) 500-200 MG-UNIT per tablet Take 2 tablets by mouth.   . docusate sodium (COLACE) 100 MG capsule Take 1 capsule (100 mg total) by mouth daily.   Marland Kitchen HYDROmorphone (DILAUDID) 2 MG tablet Take 2 mg by mouth every 4 (four) hours as needed for severe pain.   . methocarbamol (ROBAXIN) 500 MG tablet Take 500 mg by mouth 4 (four) times daily.    . Multiple Vitamin (MULTIVITAMIN) tablet Take 1 tablet by mouth daily.   Marland Kitchen PREVIDENT 5000 SENSITIVE 1.1-5 % PSTE USE ONCE PER DAY AT BEDTIME 07/04/2015: Received from: External Pharmacy  . tamoxifen (NOLVADEX) 20 MG tablet Take 1 tablet (20 mg total) by mouth daily.   . polyethylene glycol (MIRALAX / GLYCOLAX) packet Take 17 g by mouth daily as needed for mild constipation.   . varenicline (CHANTIX) 0.5 MG tablet Take 1 tablet (0.5 mg total) by mouth 2 (two) times daily. (Patient not taking: Reported on 04/01/2017)    No facility-administered encounter medications on file as of 04/01/2017.      ONCOLOGIC FAMILY HISTORY:  Family History  Problem Relation Age of Onset  . Hyperlipidemia Father   . Hypertension Father   . Kidney failure Maternal Grandmother   . Diabetes Maternal Grandfather   . Stroke Maternal Grandfather   . Lung cancer Paternal Grandmother   . Heart attack Paternal Grandfather   . Heart disease Paternal Grandfather     GENETIC COUNSELING/TESTING: Not indicated  SOCIAL HISTORY:  Tricia Thomas is married and living with her husband and two children in [place], [place].  She has 2 children a boy, 64, and girl 46.  Ms. 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  She denies any current or history of tobacco, alcohol, or illicit drug use.     PHYSICAL EXAMINATION:  Vital Signs: Vitals:   04/01/17 1111  BP: (!) 111/59  Pulse: 73  Resp: 17  Temp: 97.8 F (36.6 C)  SpO2: 100%   Filed Weights   04/01/17 1111  Weight: 131 lb 6.4 oz (59.6 kg)   General: Well-nourished, well-appearing female in no acute distress.  Unaccompanied today.   HEENT: Head is normocephalic.  Pupils equal and reactive to light. Conjunctivae clear without exudate.  Sclerae anicteric. Oral mucosa is pink, moist.  Oropharynx is pink without lesions or erythema.  Lymph: No cervical, supraclavicular, or infraclavicular lymphadenopathy noted on  palpation.  Cardiovascular: Regular rate and rhythm.Marland Kitchen Respiratory: Clear to auscultation bilaterally. Chest expansion symmetric; breathing non-labored.  Breast Exam:  -Left breast: s/p breast surgery, implant placement, swollen, and bruised from surgery, no masses or nodules noted -Right breast: s/p mastectomy and implant placement, breast is swollen and bruised from recent surgery, no masses, nodules noted -Axilla: No axillary adenopathy bilaterally.  GI: Abdomen soft and round; non-tender, non-distended. Bowel sounds normoactive. No hepatosplenomegaly.   GU: Deferred.  Neuro: No focal deficits. Steady gait.  Psych: Mood and affect normal and appropriate for situation.  MSK: No focal spinal tenderness to palpation, full range of motion in bilateral upper extremities Extremities: No edema. Skin: Warm and dry.  LABORATORY DATA:  None for this visit   DIAGNOSTIC IMAGING:  Most recent mammogram:       ASSESSMENT AND PLAN:  Ms.. Haydon is a pleasant 51 y.o. female with history of Stage 0 right breast DCIS, ER+/PR+, diagnosed in 10/2014, treated with mastectomy, and anti-estrogen therapy with Tamoxifen beginning in August, 2016.  She presents to the Survivorship Clinic for surveillance and routine follow-up.   1. History of breast cancer:  Ms. Budreau is currently clinically and radiographically without evidence of disease or recurrence of breast cancer. She will be due for mammogram in 02/2018.  She will continue her anti-estrogen therapy with Tamoxifen daily, with plans to continue for 5 years.  She will return to the cancer center to see her medical oncologist, Dr. Jana Hakim, in 03/2018.  I encouraged her to call me with any questions or concerns before her next visit at the cancer center, and I would be happy to see her sooner, if needed.    2. Bone health:  Given Ms. Aguado's  history of breast cancer, she is at minimal risk for bone demineralization. I counseled her that Tamoxifen will  have a protective effect on her bones, she was given education on specific food and activities to promote bone health.  3. Cancer screening:  Due to Ms. Eastep's history and her age, she should receive screening for skin cancers, colon cancer. She was encouraged to follow-up with her PCP for appropriate cancer screenings.   4. Health maintenance and wellness promotion: Ms. Arango was encouraged to consume 5-7 servings of fruits and vegetables per day. She was also encouraged to engage in moderate to vigorous exercise for 30 minutes per day most days of the week. She was instructed to limit her alcohol consumption and continue to abstain from tobacco use.    Dispo:  -Return to cancer center  in one year for labs and follow up with Dr. Jana Hakim -Mammogram due in 02/2018   A total of (30) minutes of face-to-face time was spent with this patient with greater than 50% of that time in counseling and care-coordination.   Gardenia Phlegm, NP [facility] [phone]   Note: PRIMARY CARE PROVIDER Carol Ada, [place] [phone]

## 2017-04-23 ENCOUNTER — Encounter: Payer: BLUE CROSS/BLUE SHIELD | Admitting: Adult Health

## 2017-09-30 ENCOUNTER — Other Ambulatory Visit: Payer: Self-pay | Admitting: Oncology

## 2018-03-31 ENCOUNTER — Other Ambulatory Visit: Payer: Self-pay

## 2018-03-31 DIAGNOSIS — C50511 Malignant neoplasm of lower-outer quadrant of right female breast: Secondary | ICD-10-CM

## 2018-03-31 NOTE — Progress Notes (Signed)
No show

## 2018-04-01 ENCOUNTER — Inpatient Hospital Stay: Payer: Commercial Managed Care - PPO | Admitting: Oncology

## 2018-04-01 ENCOUNTER — Ambulatory Visit: Payer: BLUE CROSS/BLUE SHIELD | Admitting: Oncology

## 2018-04-01 ENCOUNTER — Other Ambulatory Visit: Payer: BLUE CROSS/BLUE SHIELD

## 2018-04-01 ENCOUNTER — Inpatient Hospital Stay: Payer: Commercial Managed Care - PPO | Attending: Family Medicine

## 2018-04-02 ENCOUNTER — Encounter: Payer: Self-pay | Admitting: Oncology

## 2018-04-12 ENCOUNTER — Other Ambulatory Visit: Payer: Self-pay | Admitting: *Deleted

## 2018-04-12 MED ORDER — TAMOXIFEN CITRATE 20 MG PO TABS: ORAL_TABLET | ORAL | 0 refills | Status: DC

## 2018-04-27 ENCOUNTER — Inpatient Hospital Stay: Payer: Commercial Managed Care - PPO | Admitting: Adult Health

## 2018-04-27 ENCOUNTER — Inpatient Hospital Stay: Payer: Commercial Managed Care - PPO | Attending: Oncology

## 2018-04-27 ENCOUNTER — Encounter: Payer: Self-pay | Admitting: Adult Health

## 2018-04-27 ENCOUNTER — Telehealth: Payer: Self-pay | Admitting: Adult Health

## 2018-04-27 VITALS — BP 122/77 | HR 85 | Temp 98.6°F | Resp 20 | Ht 65.0 in | Wt 128.0 lb

## 2018-04-27 DIAGNOSIS — C50511 Malignant neoplasm of lower-outer quadrant of right female breast: Secondary | ICD-10-CM

## 2018-04-27 DIAGNOSIS — Z87891 Personal history of nicotine dependence: Secondary | ICD-10-CM | POA: Insufficient documentation

## 2018-04-27 DIAGNOSIS — Z170 Estrogen receptor positive status [ER+]: Secondary | ICD-10-CM | POA: Diagnosis not present

## 2018-04-27 DIAGNOSIS — D0511 Intraductal carcinoma in situ of right breast: Secondary | ICD-10-CM | POA: Diagnosis not present

## 2018-04-27 DIAGNOSIS — R1900 Intra-abdominal and pelvic swelling, mass and lump, unspecified site: Secondary | ICD-10-CM

## 2018-04-27 DIAGNOSIS — Z9011 Acquired absence of right breast and nipple: Secondary | ICD-10-CM | POA: Diagnosis not present

## 2018-04-27 DIAGNOSIS — Z720 Tobacco use: Secondary | ICD-10-CM

## 2018-04-27 DIAGNOSIS — R1907 Generalized intra-abdominal and pelvic swelling, mass and lump: Secondary | ICD-10-CM

## 2018-04-27 LAB — CBC WITH DIFFERENTIAL (CANCER CENTER ONLY)
BASOS ABS: 0.2 10*3/uL — AB (ref 0.0–0.1)
Basophils Relative: 2 %
EOS ABS: 0.7 10*3/uL — AB (ref 0.0–0.5)
Eosinophils Relative: 7 %
HCT: 40.1 % (ref 34.8–46.6)
HEMOGLOBIN: 13.5 g/dL (ref 11.6–15.9)
LYMPHS ABS: 2.5 10*3/uL (ref 0.9–3.3)
Lymphocytes Relative: 24 %
MCH: 31.4 pg (ref 25.1–34.0)
MCHC: 33.7 g/dL (ref 31.5–36.0)
MCV: 93.3 fL (ref 79.5–101.0)
Monocytes Absolute: 1.1 10*3/uL — ABNORMAL HIGH (ref 0.1–0.9)
Monocytes Relative: 11 %
Neutro Abs: 5.9 10*3/uL (ref 1.5–6.5)
Neutrophils Relative %: 56 %
Platelet Count: 296 10*3/uL (ref 145–400)
RBC: 4.3 MIL/uL (ref 3.70–5.45)
RDW: 13.5 % (ref 11.2–14.5)
WBC Count: 10.4 10*3/uL — ABNORMAL HIGH (ref 3.9–10.3)

## 2018-04-27 LAB — CMP (CANCER CENTER ONLY)
ALBUMIN: 4.1 g/dL (ref 3.5–5.0)
ALK PHOS: 68 U/L (ref 38–126)
ALT: 12 U/L (ref 0–44)
AST: 17 U/L (ref 15–41)
Anion gap: 8 (ref 5–15)
BUN: 11 mg/dL (ref 6–20)
CHLORIDE: 108 mmol/L (ref 98–111)
CO2: 28 mmol/L (ref 22–32)
CREATININE: 1.02 mg/dL — AB (ref 0.44–1.00)
Calcium: 9.9 mg/dL (ref 8.9–10.3)
GFR, Estimated: >60 mL/min (ref >60)
GLUCOSE: 90 mg/dL (ref 70–99)
Potassium: 4.1 mmol/L (ref 3.5–5.1)
SODIUM: 144 mmol/L (ref 135–145)
Total Bilirubin: 0.4 mg/dL (ref 0.3–1.2)
Total Protein: 7.2 g/dL (ref 6.5–8.1)

## 2018-04-27 MED ORDER — VARENICLINE TARTRATE 0.5 MG X 11 & 1 MG X 42 PO MISC: ORAL | 0 refills | Status: DC

## 2018-04-27 MED ORDER — TAMOXIFEN CITRATE 20 MG PO TABS: ORAL_TABLET | ORAL | 3 refills | Status: DC

## 2018-04-27 MED ORDER — TAMOXIFEN CITRATE 20 MG PO TABS: ORAL_TABLET | ORAL | 0 refills | Status: DC

## 2018-04-27 NOTE — Telephone Encounter (Signed)
Gave pt avs and calendar  °

## 2018-04-27 NOTE — Progress Notes (Signed)
CLINIC:  Survivorship   REASON FOR VISIT:  Routine follow-up for history of breast cancer.   BRIEF ONCOLOGIC HISTORY:    Malignant neoplasm of lower-outer quadrant of right breast of female, estrogen receptor positive ([place])   09/03/2013 Mammogram    Left breast: grouped, heterogenous calcifications at posterior depth medial region. No findings in right breast.    09/08/2013 Breast US    Left breast: grouped, scattered punctate round calcifications in the left breast, posterior depth, medial region.  No other masses appreciated.  Likely benign. Recommend 6 month follow up.     03/20/2014 Breast US    Left breast: benign appearing scattered calcifications noted in left breast, appear unchanged.  Recommend 6 month follow up at time of bilateral mammogram.    10/24/2014 Breast US    Right breast: 2.2 cm segmental heterogenous calcifications at 8:00, middle depth. Recommend biopsy.  Stable grouped scattered punctate round calcifications in the left breast, posterior depth, medial region.    10/26/2014 Initial Biopsy    [facility] breast needle core biopsy: DCIS with calcifications, low grade, ER+ (100%), PR+ (100%).    11/10/2014 Breast MRI    Right breast: Suspicious NME extending from the bx cavity anterior lateral margin concerning for associated disease. Nonspecific enhancing mass within the central to slightly superior right breast. Sl. irregular enhancing mass within left breast.    11/24/2014 Procedure    MRI guided biopsies of right upper inner (fibrocystic changes, fibroadenoma) and lower outer (intraductal papilloma with ADH) breast lesions. No malignancy noted.    11/27/2014 Procedure    MRI guided biopsy of left breast: fibrocystic changes, ductal hyperplasia, no evidence of malignancy    11/27/2014 Clinical Stage    Stage 0: Tis N0    02/06/2015 Definitive Surgery    Right mastectomy with implant recons: DCIS with calcifications, low grade, 1.6 cm, scattered ADH, focally positive  anterior margin, 2 right axillary lymph nodes removed, negative (0/2). Soft tissue bx of rt axilla: fibrocystic changes / ductal hyperplasia    02/06/2015 Pathologic Stage    Stage 0: pTis pN0     Radiation Therapy    Not recommended due to concerns about cosmesis    03/16/2015 -  Anti-estrogen oral therapy    Tamoxifen 20 mg daily (Magrinat). Planned duration of therapy: 5 years.    04/10/2015 Survivorship    Survivorship care plan mailed to patient as she was unable to come for in-person visit at this time.      INTERVAL HISTORY:  Ms. Egge presents to the Survivorship Clinic today for routine follow-up for her history of breast cancer.  Overall, she reports feeling quite well. She is is taking Tamoxifen daily and is tolerating it well.  She was previously out and hadn't taken it for a few months.  She is also interested in taking Chantix again for smoking cessation.  She has tried chantix previously.  She last took Chantix one to two years ago and was able to quit smoking.  She unfortunately started back smoking about the beginning of 2019 and would really like to quit again.  She is ready to quit.  She has about a 15 year smoking history and she smokes about 1/2ppd, 7.5 pack year tobacco history.    Mikael is doing well.  She has a cough and URI today.  She has undergone her mammogram in August at [facility] that was normal.  She is up to date with colon cancer screening.  She has not undergone  skin cancer screening.  She is s/p TAH and no longer requires pap smears.  No h/o abnormal pap smears.  She sees Dr. Tamala Julian, and does have physicals with her.      REVIEW OF SYSTEMS:  Review of Systems  Constitutional: Positive for unexpected weight change. Negative for appetite change, chills and fatigue.  HENT:   Negative for hearing loss, lump/mass and trouble swallowing.   Eyes: Negative for eye problems and icterus.  Respiratory: Positive for cough. Negative for chest tightness.     Cardiovascular: Negative for chest pain, leg swelling and palpitations.  Gastrointestinal: Negative for abdominal distention, abdominal pain, constipation, diarrhea, nausea and vomiting.  Endocrine: Negative for hot flashes.  Musculoskeletal: Negative for arthralgias.  Skin: Negative for itching and rash.  Neurological: Negative for dizziness, extremity weakness and headaches.  Hematological: Negative for adenopathy. Does not bruise/bleed easily.  Psychiatric/Behavioral: Negative for depression. The patient is not nervous/anxious.   Breast: Denies any new nodularity, masses, tenderness, nipple changes, or nipple discharge.       PAST MEDICAL/SURGICAL HISTORY:  Past Medical History:  Diagnosis Date  . Breast cancer ([place])   . Breast cancer of lower-outer quadrant of right female breast ([place]) 11/01/2014  . Constipation    takes Miralax  . Dysrhythmia   . H/O sinus tachycardia 2014   evaluated By Cardiologist, Dr Harrington Challenger  . Syncope    sudden onset   Past Surgical History:  Procedure Laterality Date  . APPENDECTOMY  1990  . BREAST BIOPSY Bilateral    "I've had several on both sides"  . BREAST RECONSTRUCTION WITH PLACEMENT OF TISSUE EXPANDER AND FLEX HD (ACELLULAR HYDRATED DERMIS) Right 02/06/2015   Procedure:  PLACEMENT OF RIGHT BREAST TISSUE EXPANDER OR POSSIBLE IMPLANT FOR BREAST RECONSTRUCTION;  Surgeon: Crissie Reese, MD;  Location: [facility];  Service: Plastics;  Laterality: Right;  . MASTECTOMY COMPLETE / SIMPLE W/ SENTINEL NODE BIOPSY Right 02/06/2015  . NIPPLE SPARING MASTECTOMY/SENTINAL LYMPH NODE BIOPSY/RECONSTRUCTION/PLACEMENT OF TISSUE EXPANDER Right 02/06/2015   Procedure: NIPPLE SPARING MASTECTOMY WITH SENTINAL LYMPH NODE BIOPSY;  Surgeon: Stark Klein, MD;  Location: [facility];  Service: General;  Laterality: Right;  . RECONSTRUCTION BREAST IMMEDIATE / DELAYED W/ TISSUE EXPANDER Right 02/06/2015  . VAGINAL HYSTERECTOMY  1989  . WISDOM TOOTH EXTRACTION       ALLERGIES:  No Known  Allergies   CURRENT MEDICATIONS:  Outpatient Encounter Medications as of 04/27/2018  Medication Sig Note  . calcium-vitamin D (OSCAL WITH D) 500-200 MG-UNIT per tablet Take 2 tablets by mouth.   . Multiple Vitamin (MULTIVITAMIN) tablet Take 1 tablet by mouth daily.   . tamoxifen (NOLVADEX) 20 MG tablet TAKE 1 TABLET(20 MG) BY MOUTH DAILY   . [DISCONTINUED] tamoxifen (NOLVADEX) 20 MG tablet TAKE 1 TABLET(20 MG) BY MOUTH DAILY   . [DISCONTINUED] tamoxifen (NOLVADEX) 20 MG tablet TAKE 1 TABLET(20 MG) BY MOUTH DAILY   . varenicline (CHANTIX PAK) 0.5 MG X 11 & 1 MG X 42 tablet Take one 0.5 mg tablet by mouth once daily for 3 days, then increase to one 0.5 mg tablet twice daily for 4 days, then increase to one 1 mg tablet twice daily.   . [DISCONTINUED] docusate sodium (COLACE) 100 MG capsule Take 1 capsule (100 mg total) by mouth daily. (Patient not taking: Reported on 04/27/2018)   . [DISCONTINUED] HYDROmorphone (DILAUDID) 2 MG tablet Take 2 mg by mouth every 4 (four) hours as needed for severe pain.   . [DISCONTINUED] methocarbamol (ROBAXIN) 500 MG tablet  Take 500 mg by mouth 4 (four) times daily.   . [DISCONTINUED] polyethylene glycol (MIRALAX / GLYCOLAX) packet Take 17 g by mouth daily as needed for mild constipation.   . [DISCONTINUED] PREVIDENT 5000 SENSITIVE 1.1-5 % PSTE USE ONCE PER DAY AT BEDTIME 07/04/2015: Received from: External Pharmacy  . [DISCONTINUED] varenicline (CHANTIX) 0.5 MG tablet Take 1 tablet (0.5 mg total) by mouth 2 (two) times daily. (Patient not taking: Reported on 04/01/2017)    No facility-administered encounter medications on file as of 04/27/2018.      ONCOLOGIC FAMILY HISTORY:  Family History  Problem Relation Age of Onset  . Hyperlipidemia Father   . Hypertension Father   . Kidney failure Maternal Grandmother   . Diabetes Maternal Grandfather   . Stroke Maternal Grandfather   . Lung cancer Paternal Grandmother   . Heart attack Paternal Grandfather   . Heart  disease Paternal Grandfather     GENETIC COUNSELING/TESTING: Not at this time  SOCIAL HISTORY:  Social History   Socioeconomic History  . Marital status: Married    Spouse name: Not on file  . Number of children: Not on file  . Years of education: Not on file  . Highest education level: Not on file  Occupational History  . Not on file  Social Needs  . Financial resource strain: Not on file  . Food insecurity:    Worry: Not on file    Inability: Not on file  . Transportation needs:    Medical: Not on file    Non-medical: Not on file  Tobacco Use  . Smoking status: Former Smoker    Packs/day: 1.00    Years: 10.00    Pack years: 10.00    Types: Cigarettes    Last attempt to quit: 12/02/2014    Years since quitting: 3.4  . Smokeless tobacco: Never Used  Substance and Sexual Activity  . Alcohol use: No  . Drug use: No  . Sexual activity: Yes    Birth control/protection: Surgical  Lifestyle  . Physical activity:    Days per week: Not on file    Minutes per session: Not on file  . Stress: Not on file  Relationships  . Social connections:    Talks on phone: Not on file    Gets together: Not on file    Attends religious service: Not on file    Active member of club or organization: Not on file    Attends meetings of clubs or organizations: Not on file    Relationship status: Not on file  . Intimate partner violence:    Fear of current or ex partner: Not on file    Emotionally abused: Not on file    Physically abused: Not on file    Forced sexual activity: Not on file  Other Topics Concern  . Not on file  Social History Narrative  . Not on file      PHYSICAL EXAMINATION:  Vital Signs: Vitals:   04/27/18 0857  BP: 122/77  Pulse: 85  Resp: 20  Temp: 98.6 F (37 C)  SpO2: 100%   Filed Weights   04/27/18 0857  Weight: 128 lb (58.1 kg)   General: Well-nourished, well-appearing female in no acute distress.  Unaccompanied today.   HEENT: Head is  normocephalic.  Pupils equal and reactive to light. Conjunctivae clear without exudate.  Sclerae anicteric. Oral mucosa is pink, moist.  Oropharynx is pink without lesions or erythema.  Lymph: No cervical, supraclavicular, or infraclavicular  lymphadenopathy noted on palpation.  Cardiovascular: Regular rate and rhythm.Marland Kitchen Respiratory: Clear to auscultation bilaterally. Chest expansion symmetric; breathing non-labored.  Breast Exam:  Right breast s/p mastectomy and reconstruction, no sign of local recurrence, left breast benign -Axilla: No axillary adenopathy bilaterally.  GI: Abdomen soft and round; non-tender, lower abdomen/pelvis is distended, (patient notes new over past 6 months see below). Bowel sounds normoactive. No hepatosplenomegaly.   GU: Deferred.  Neuro: No focal deficits. Steady gait.  Psych: Mood and affect normal and appropriate for situation.  MSK: No focal spinal tenderness to palpation, full range of motion in bilateral upper extremities Extremities: No edema. Skin: Warm and dry.  LABORATORY DATA:  Appointment on 04/27/2018  Component Date Value Ref Range Status  . WBC Count 04/27/2018 10.4* 3.9 - 10.3 K/uL Final  . RBC 04/27/2018 4.30  3.70 - 5.45 MIL/uL Final  . Hemoglobin 04/27/2018 13.5  11.6 - 15.9 g/dL Final  . HCT 04/27/2018 40.1  34.8 - 46.6 % Final  . MCV 04/27/2018 93.3  79.5 - 101.0 fL Final  . MCH 04/27/2018 31.4  25.1 - 34.0 pg Final  . MCHC 04/27/2018 33.7  31.5 - 36.0 g/dL Final  . RDW 04/27/2018 13.5  11.2 - 14.5 % Final  . Platelet Count 04/27/2018 296  145 - 400 K/uL Final  . Neutrophils Relative % 04/27/2018 56  % Final  . Neutro Abs 04/27/2018 5.9  1.5 - 6.5 K/uL Final  . Lymphocytes Relative 04/27/2018 24  % Final  . Lymphs Abs 04/27/2018 2.5  0.9 - 3.3 K/uL Final  . Monocytes Relative 04/27/2018 11  % Final  . Monocytes Absolute 04/27/2018 1.1* 0.1 - 0.9 K/uL Final  . Eosinophils Relative 04/27/2018 7  % Final  . Eosinophils Absolute 04/27/2018  0.7* 0.0 - 0.5 K/uL Final  . Basophils Relative 04/27/2018 2  % Final  . Basophils Absolute 04/27/2018 0.2* 0.0 - 0.1 K/uL Final   Performed at [facility], [place] [address] [place], [place] [address]  . Sodium 04/27/2018 144  135 - 145 mmol/L Final  . Potassium 04/27/2018 4.1  3.5 - 5.1 mmol/L Final  . Chloride 04/27/2018 108  98 - 111 mmol/L Final  . CO2 04/27/2018 28  22 - 32 mmol/L Final  . Glucose, Bld 04/27/2018 90  70 - 99 mg/dL Final  . BUN 04/27/2018 11  6 - 20 mg/dL Final  . Creatinine 04/27/2018 1.02* 0.44 - 1.00 mg/dL Final  . Calcium 04/27/2018 9.9  8.9 - 10.3 mg/dL Final  . Total Protein 04/27/2018 7.2  6.5 - 8.1 g/dL Final  . Albumin 04/27/2018 4.1  3.5 - 5.0 g/dL Final  . AST 04/27/2018 17  15 - 41 U/L Final  . ALT 04/27/2018 12  0 - 44 U/L Final  . Alkaline Phosphatase 04/27/2018 68  38 - 126 U/L Final  . Total Bilirubin 04/27/2018 0.4  0.3 - 1.2 mg/dL Final  . GFR, Est Non Af Am 04/27/2018 >60  >60 mL/min Final  . GFR, Est AFR Am 04/27/2018 >60  >60 mL/min Final   Comment: (NOTE) The eGFR has been calculated using the CKD EPI equation. This calculation has not been validated in all clinical situations. eGFR's persistently <60 mL/min signify possible Chronic Kidney Disease.   Georgiann Hahn gap 04/27/2018 8  5 - 15 Final   Performed at [facility], [place] [address] [place], [place] [address]     DIAGNOSTIC IMAGING:  Most recent mammogram: Done  on 03/10/18 at [facility] and was normal, breast density c    ASSESSMENT AND PLAN:  Ms.. Tricia Thomas is a pleasant 52 y.o. female with history of Stage 0 right breast DCIS, ER+/PR+, diagnosed in 09/2014, treated with mastectomy and anti-estrogen therapy with Tamoxifen beginning in 02/2015.  She presents to the Survivorship Clinic for surveillance and routine follow-up.   1. History of breast cancer:  Ms. Hitz is currently clinically and radiographically without evidence of disease or  recurrence of breast cancer. She will be due for mammogram in 02/2019.  She will continue her anti-estrogen therapy with Tamoxifen, with plans to continue for 5 years.  She will return to the cancer center to see her medical oncologist, Dr. Jana Hakim in 1 year (tentatively).  I encouraged her to call me with any questions or concerns before her next visit at the cancer center, and I would be happy to see her sooner, if needed.    2. Pelvic swelling/distention: Ranika has new pelvic swelling.  After finding this on exam, I asked her several questions.  She says over the past 6 months she has noted her pants are tighter around her pelvis, and she has even noted some intermittent abdominal/pelvic pain.  She has not been evaluated for this.  I ordered a CT a/p and we are hoping to get this done between Wednesday and Friday of this week.    3. Tobacco abuse: Smoking cessation counseling provided.  Prescribed Chantix starter pack, gave handouts on smoking cessation.    4. Bone health:  Given Ms. Friar's tobacco use, history of breast cancer, she is at risk for bone demineralization. I counseled her that Tamoxifen has a protective effect on the bones.  She was given education on specific food and activities to promote bone health.  5. Cancer screening:  Due to Ms. Jaroszewski's history and her age, she should receive screening for skin cancers, colon cancer.  She was encouraged to follow-up with her PCP for appropriate cancer screenings.   6. Health maintenance and wellness promotion: Ms. Cronin was encouraged to consume 5-7 servings of fruits and vegetables per day. She was also encouraged to engage in moderate to vigorous exercise for 30 minutes per day most days of the week. She was instructed to limit her alcohol consumption and was encouraged stop smoking.      Dispo:  -Return to cancer center tentatively in one year -Mammogram in 02/2019 -CT A/P ASAP   A total of (30) minutes of face-to-face time was  spent with this patient with greater than 50% of that time in counseling and care-coordination.   Gardenia Phlegm, NP Survivorship Program [facility] [phone]   Note: PRIMARY CARE PROVIDER Carol Ada, [place] [phone]

## 2018-04-27 NOTE — Patient Instructions (Signed)
Steps to Quit Smoking Smoking tobacco can be harmful to your health and can affect almost every organ in your body. Smoking puts you, and those around you, at risk for developing many serious chronic diseases. Quitting smoking is difficult, but it is one of the best things that you can do for your health. It is never too late to quit. What are the benefits of quitting smoking? When you quit smoking, you lower your risk of developing serious diseases and conditions, such as:  Lung cancer or lung disease, such as COPD.  Heart disease.  Stroke.  Heart attack.  Infertility.  Osteoporosis and bone fractures.  Additionally, symptoms such as coughing, wheezing, and shortness of breath may get better when you quit. You may also find that you get sick less often because your body is stronger at fighting off colds and infections. If you are pregnant, quitting smoking can help to reduce your chances of having a baby of low birth weight. How do I get ready to quit? When you decide to quit smoking, create a plan to make sure that you are successful. Before you quit:  Pick a date to quit. Set a date within the next two weeks to give you time to prepare.  Write down the reasons why you are quitting. Keep this list in places where you will see it often, such as on your bathroom mirror or in your car or wallet.  Identify the people, places, things, and activities that make you want to smoke (triggers) and avoid them. Make sure to take these actions: ? Throw away all cigarettes at home, at work, and in your car. ? Throw away smoking accessories, such as ashtrays and lighters. ? Clean your car and make sure to empty the ashtray. ? Clean your home, including curtains and carpets.  Tell your family, friends, and coworkers that you are quitting. Support from your loved ones can make quitting easier.  Talk with your health care provider about your options for quitting smoking.  Find out what treatment  options are covered by your health insurance.  What strategies can I use to quit smoking? Talk with your healthcare provider about different strategies to quit smoking. Some strategies include:  Quitting smoking altogether instead of gradually lessening how much you smoke over a period of time. Research shows that quitting "cold turkey" is more successful than gradually quitting.  Attending in-person counseling to help you build problem-solving skills. You are more likely to have success in quitting if you attend several counseling sessions. Even short sessions of 10 minutes can be effective.  Finding resources and support systems that can help you to quit smoking and remain smoke-free after you quit. These resources are most helpful when you use them often. They can include: ? Online chats with a counselor. ? Telephone quitlines. ? Printed self-help materials. ? Support groups or group counseling. ? Text messaging programs. ? Mobile phone applications.  Taking medicines to help you quit smoking. (If you are pregnant or breastfeeding, talk with your health care provider first.) Some medicines contain nicotine and some do not. Both types of medicines help with cravings, but the medicines that include nicotine help to relieve withdrawal symptoms. Your health care provider may recommend: ? Nicotine patches, gum, or lozenges. ? Nicotine inhalers or sprays. ? Non-nicotine medicine that is taken by mouth.  Talk with your health care provider about combining strategies, such as taking medicines while you are also receiving in-person counseling. Using these two strategies together   makes you more likely to succeed in quitting than if you used either strategy on its own. If you are pregnant or breastfeeding, talk with your health care provider about finding counseling or other support strategies to quit smoking. Do not take medicine to help you quit smoking unless told to do so by your health care  provider. What things can I do to make it easier to quit? Quitting smoking might feel overwhelming at first, but there is a lot that you can do to make it easier. Take these important actions:  Reach out to your family and friends and ask that they support and encourage you during this time. Call telephone quitlines, reach out to support groups, or work with a counselor for support.  Ask people who smoke to avoid smoking around you.  Avoid places that trigger you to smoke, such as bars, parties, or smoke-break areas at work.  Spend time around people who do not smoke.  Lessen stress in your life, because stress can be a smoking trigger for some people. To lessen stress, try: ? Exercising regularly. ? Deep-breathing exercises. ? Yoga. ? Meditating. ? Performing a body scan. This involves closing your eyes, scanning your body from head to toe, and noticing which parts of your body are particularly tense. Purposefully relax the muscles in those areas.  Download or purchase mobile phone or tablet apps (applications) that can help you stick to your quit plan by providing reminders, tips, and encouragement. There are many free apps, such as QuitGuide from the CDC (Centers for Disease Control and Prevention). You can find other support for quitting smoking (smoking cessation) through smokefree.gov and other websites.  How will I feel when I quit smoking? Within the first 24 hours of quitting smoking, you may start to feel some withdrawal symptoms. These symptoms are usually most noticeable 2-3 days after quitting, but they usually do not last beyond 2-3 weeks. Changes or symptoms that you might experience include:  Mood swings.  Restlessness, anxiety, or irritation.  Difficulty concentrating.  Dizziness.  Strong cravings for sugary foods in addition to nicotine.  Mild weight gain.  Constipation.  Nausea.  Coughing or a sore throat.  Changes in how your medicines work in your  body.  A depressed mood.  Difficulty sleeping (insomnia).  After the first 2-3 weeks of quitting, you may start to notice more positive results, such as:  Improved sense of smell and taste.  Decreased coughing and sore throat.  Slower heart rate.  Lower blood pressure.  Clearer skin.  The ability to breathe more easily.  Fewer sick days.  Quitting smoking is very challenging for most people. Do not get discouraged if you are not successful the first time. Some people need to make many attempts to quit before they achieve long-term success. Do your best to stick to your quit plan, and talk with your health care provider if you have any questions or concerns. This information is not intended to replace advice given to you by your health care provider. Make sure you discuss any questions you have with your health care provider. Document Released: 07/08/2001 Document Revised: 03/11/2016 Document Reviewed: 11/28/2014 Elsevier Interactive Patient Education  2018 Elsevier Inc.  

## 2018-04-28 ENCOUNTER — Telehealth: Payer: Self-pay | Admitting: Adult Health

## 2018-04-28 ENCOUNTER — Ambulatory Visit (HOSPITAL_COMMUNITY)
Admission: RE | Admit: 2018-04-28 | Discharge: 2018-04-28 | Disposition: A | Discharge status: Home / Self Care | Payer: Commercial Managed Care - PPO | Source: Ambulatory Visit | Attending: Adult Health | Admitting: Adult Health

## 2018-04-28 ENCOUNTER — Encounter (HOSPITAL_COMMUNITY): Payer: Self-pay

## 2018-04-28 DIAGNOSIS — C50511 Malignant neoplasm of lower-outer quadrant of right female breast: Secondary | ICD-10-CM | POA: Diagnosis not present

## 2018-04-28 DIAGNOSIS — R1900 Intra-abdominal and pelvic swelling, mass and lump, unspecified site: Secondary | ICD-10-CM | POA: Insufficient documentation

## 2018-04-28 DIAGNOSIS — Z710 Person encountering health services to consult on behalf of another person: Secondary | ICD-10-CM | POA: Diagnosis not present

## 2018-04-28 DIAGNOSIS — R1907 Generalized intra-abdominal and pelvic swelling, mass and lump: Secondary | ICD-10-CM | POA: Insufficient documentation

## 2018-04-28 DIAGNOSIS — Z170 Estrogen receptor positive status [ER+]: Secondary | ICD-10-CM

## 2018-04-28 MED ORDER — SODIUM CHLORIDE 0.9 % IJ SOLN
INTRAMUSCULAR | Status: AC
  Filled 2018-04-28: qty 50

## 2018-04-28 MED ORDER — IOHEXOL 300 MG/ML  SOLN
100.0000 mL | Freq: Once | INTRAMUSCULAR | Status: AC | PRN
  Administered 2018-04-28: 100 mL via INTRAVENOUS

## 2018-04-28 NOTE — Telephone Encounter (Signed)
Called patient and reviewed CT scan results with her.  She has an 18cm ovarian mass and I recommended urgent f/u and evaluation with GYN oncology.  I reviewed the other parts of her scan which include a kidney cyst, and a gall bladder polyp.  I let her know that Joylene John, NP from gyn-oncology would call her with a new patient appointment.  Will forward results of scan to patient PCP Dr. Tamala Julian.  She says she saw Dr. Tamala Julian in February of this year.    Wilber Bihari, NP

## 2018-04-29 ENCOUNTER — Encounter: Payer: Self-pay | Admitting: Gynecologic Oncology

## 2018-04-29 ENCOUNTER — Inpatient Hospital Stay: Payer: Commercial Managed Care - PPO

## 2018-04-29 ENCOUNTER — Inpatient Hospital Stay (HOSPITAL_BASED_OUTPATIENT_CLINIC_OR_DEPARTMENT_OTHER): Payer: Commercial Managed Care - PPO | Admitting: Gynecologic Oncology

## 2018-04-29 VITALS — BP 120/88 | HR 82 | Temp 98.6°F | Resp 18 | Ht 65.0 in | Wt 125.1 lb

## 2018-04-29 DIAGNOSIS — R1900 Intra-abdominal and pelvic swelling, mass and lump, unspecified site: Secondary | ICD-10-CM

## 2018-04-29 DIAGNOSIS — J019 Acute sinusitis, unspecified: Secondary | ICD-10-CM

## 2018-04-29 LAB — CEA (IN HOUSE-CHCC): CEA (CHCC-In House): 2.43 ng/mL (ref 0.00–5.00)

## 2018-04-29 MED ORDER — AZITHROMYCIN 250 MG PO TABS: ORAL_TABLET | ORAL | 0 refills | Status: DC

## 2018-04-29 NOTE — Patient Instructions (Addendum)
Preparing for your Surgery  Plan for surgery on May 04, 2018 with Dr. Everitt Amber at [facility] will be scheduled for an exploratory laparotomy, bilateral salpingo-oophorectomy, possible staging if cancer identified.  Pre-operative Testing -You will receive a phone call from presurgical testing at [facility] to arrange for a pre-operative testing appointment before your surgery.  This appointment normally occurs one to two weeks before your scheduled surgery.   -Bring your insurance card, copy of an advanced directive if applicable, medication list  -At that visit, you will be asked to sign a consent for a possible blood transfusion in case a transfusion becomes necessary during surgery.  The need for a blood transfusion is rare but having consent is a necessary part of your care.     -You should not be taking blood thinners or aspirin at least ten days prior to surgery unless instructed by your surgeon.  Starting at 4pm the day before surgery, begin drinking two bottles of magnesium citrate and only take in clear liquids after that time.  Day Before Surgery at [facility] will be asked to take in a light diet the day before surgery.  Avoid carbonated beverages.  You will be advised to have nothing to eat or drink after midnight the evening before.    Eat a light diet the day before surgery.  Examples including soups, broths, toast, yogurt, mashed potatoes.  Things to avoid include carbonated beverages (fizzy beverages), raw fruits and raw vegetables, or beans.   If your bowels are filled with gas, your surgeon will have difficulty visualizing your pelvic organs which increases your surgical risks.  Your role in recovery Your role is to become active as soon as directed by your doctor, while still giving yourself time to heal.  Rest when you feel tired. You will be asked to do the following in order to speed your recovery:  - Cough and breathe deeply.  This helps toclear and expand your lungs and can prevent pneumonia. You may be given a spirometer to practice deep breathing. A staff member will show you how to use the spirometer. - Do mild physical activity. Walking or moving your legs help your circulation and body functions return to normal. A staff member will help you when you try to walk and will provide you with simple exercises. Do not try to get up or walk alone the first time. - Actively manage your pain. Managing your pain lets you move in comfort. We will ask you to rate your pain on a scale of zero to 10. It is your responsibility to tell your doctor or nurse where and how much you hurt so your pain can be treated.  Special Considerations -If you are diabetic, you may be placed on insulin after surgery to have closer control over your blood sugars to promote healing and recovery.  This does not mean that you will be discharged on insulin.  If applicable, your oral antidiabetics will be resumed when you are tolerating a solid diet.  -Your final pathology results from surgery should be available by the Friday after surgery and the results will be relayed to you when available.  -Dr. Lahoma Crocker is the Surgeon that assists your GYN Oncologist with surgery.  The next day after your surgery you will either see your GYN Oncologist or Dr. Lahoma Crocker.   Blood Transfusion Information WHAT IS A BLOOD TRANSFUSION? A transfusion is the replacement of blood or some of its  parts. Blood is made up of multiple cells which provide different functions.  Red blood cells carry oxygen and are used for blood loss replacement.  White blood cells fight against infection.  Platelets control bleeding.  Plasma helps clot blood.  Other blood products are available for specialized needs, such as hemophilia or other clotting disorders. BEFORE THE TRANSFUSION  Who gives blood for transfusions?   You may be able to donate blood to be used  at a later date on yourself (autologous donation).  Relatives can be asked to donate blood. This is generally not any safer than if you have received blood from a stranger. The same precautions are taken to ensure safety when a relative's blood is donated.  Healthy volunteers who are fully evaluated to make sure their blood is safe. This is blood bank blood. Transfusion therapy is the safest it has ever been in the practice of medicine. Before blood is taken from a donor, a complete history is taken to make sure that person has no history of diseases nor engages in risky social behavior (examples are intravenous drug use or sexual activity with multiple partners). The donor's travel history is screened to minimize risk of transmitting infections, such as malaria. The donated blood is tested for signs of infectious diseases, such as HIV and hepatitis. The blood is then tested to be sure it is compatible with you in order to minimize the chance of a transfusion reaction. If you or a relative donates blood, this is often done in anticipation of surgery and is not appropriate for emergency situations. It takes many days to process the donated blood. RISKS AND COMPLICATIONS Although transfusion therapy is very safe and saves many lives, the main dangers of transfusion include:   Getting an infectious disease.  Developing a transfusion reaction. This is an allergic reaction to something in the blood you were given. Every precaution is taken to prevent this. The decision to have a blood transfusion has been considered carefully by your caregiver before blood is given. Blood is not given unless the benefits outweigh the risks.

## 2018-04-29 NOTE — H&P (View-Only) (Signed)
Consult Note: Gyn-Onc  Consult was requested by nurse practitioner Tricia Thomas for the evaluation of Tricia Thomas 52 y.o. female  CC:  Chief Complaint  Patient presents with  . Pelvic mass    Assessment/Plan:  [name] is a 52 y.o. with a unilateral presumed right and large adnexal mass measuring 18 cm with minimal mobility.  Ca1 25 has been ordered.  The plan is for a laparotomy with bilateral salpingo-oophorectomy and other indicated procedures which could include lymph node dissection omentectomy bowel resection.  Fair amount of time was spent discussing the length of the incision, and that our goal was to keep it as small as feasible until a diagnosis is been made.  Discussed that comanagement of the gallbladder polyp or the renal cysts would require a surgical approachthat was distinct from what is plan for management of this adnexal mass under such evaluation will need to be staged. If this is a gynecologic malignancy we discussed that omentectomy will require extension of the incision at minimum to the level of the umbilicus.  The risks of the procedure discussed included infection bleeding damage to surrounding structures prolonged hospitalization reoperation.  All of her questions and those of her husband were answered to their  Satisfaction  Will treat with a Z-Pak.  Surgical procedure is scheduled with Dr. Everitt Thomas on May 04, 2018  HPI: [name] is a 52 y.o. 2 para 2 who per the notes has reported 6 months of lower abdomen pelvis distention.  She does verbalize changes in urinary urgency over the last month.  It there is no weight loss no change in appetite no early satiety no nausea vomiting.  A CT of the abdomen and pelvis on April 28, 2018 is notable for a an 8 mm polypoid soft tissue density in the gallbladder consistent with a gallbladder polyp right kidney is noted to have a complex cystic lesion 5.7 x 4.0 with no thickening or nodular  septations mural soft tissue densities moderate right hydronephrosis due to the large pelvic mass.  Within the mass is a complex cystic mass in the central pelvis extending to the lower abdomen with measures 18.4 x 10.3 x 12.7 cm this mass contains numerous thin internal septations with contrast enhancement but no thickened septations or solid mural nodules are identified this mass causes compression of the right ureter and moderate right hydronephrosis.  Ca1 25 is not available for review.  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    Review of Systems:  Constitutional  Feels well, reports cough from sinus drip and yellow expectorant Cardiovascular  No chest pain, shortness of breath, or edema  Pulmonary  No cough or wheeze.  Gastro Intestinal  No nausea, vomitting, or diarrhoea. No bright red blood per rectum, no abdominal pain, change in bowel movement, or constipation.  No early satiety no weight loss no change in appetite Genito Urinary  No frequency, which 1 month of urinary urgency, no dysuria, no vaginal bleeding  musculo Skeletal  No myalgia, arthralgia, joint swelling or pain  Neurologic  No weakness, numbness, change in gait,  Psychology  No depression, anxiety, insomnia.    Current Meds:  Outpatient Encounter Medications as of 04/29/2018  Medication Sig  . calcium-vitamin D (OSCAL WITH D) 500-200 MG-UNIT per tablet  Take 2 tablets by mouth.  . Multiple Vitamin (MULTIVITAMIN) tablet Take 1 tablet by mouth daily.  . tamoxifen (NOLVADEX) 20 MG tablet TAKE 1 TABLET(20 MG) BY MOUTH DAILY  . varenicline (CHANTIX PAK) 0.5 MG X 11 & 1 MG X 42 tablet Take one 0.5 mg tablet by mouth once daily for 3 days, then increase to one 0.5 mg tablet twice daily for 4 days, then increase to one 1  mg tablet twice daily.  Marland Kitchen azithromycin (ZITHROMAX) 250 MG tablet Take 500 mg (2 tablets) once then 250 mg daily for 4 days   No facility-administered encounter medications on file as of 04/29/2018.     Allergy: No Known Allergies  Social Hx:   Social History   Socioeconomic History  . Marital status: Married    Spouse name: Not on file  . Number of children: Not on file  . Years of education: Not on file  . Highest education level: Not on file  Occupational History  . Not on file  Social Needs  . Financial resource strain: Not on file  . Food insecurity:    Worry: Not on file    Inability: Not on file  . Transportation needs:    Medical: Not on file    Non-medical: Not on file  Tobacco Use  . Smoking status: Former Smoker    Packs/day: 1.00    Years: 10.00    Pack years: 10.00    Types: Cigarettes    Last attempt to quit: 12/02/2014    Years since quitting: 3.4  . Smokeless tobacco: Never Used  Substance and Sexual Activity  . Alcohol use: No  . Drug use: No  . Sexual activity: Yes    Birth control/protection: Surgical  Lifestyle  . Physical activity:    Days per week: Not on file    Minutes per session: Not on file  . Stress: Not on file  Relationships  . Social connections:    Talks on phone: Not on file    Gets together: Not on file    Attends religious service: Not on file    Active member of club or organization: Not on file    Attends meetings of clubs or organizations: Not on file    Relationship status: Not on file  . Intimate partner violence:    Fear of current or ex partner: Not on file    Emotionally abused: Not on file    Physically abused: Not on file    Forced sexual activity: Not on file  Other Topics Concern  . Not on file  Social History Narrative  . Not on file    Past Surgical Hx:  Past Surgical History:  Procedure Laterality Date  . APPENDECTOMY  1990  . BREAST BIOPSY Bilateral    "I've had several on both sides"  . BREAST  RECONSTRUCTION WITH PLACEMENT OF TISSUE EXPANDER AND FLEX HD (ACELLULAR HYDRATED DERMIS) Right 02/06/2015   Procedure:  PLACEMENT OF RIGHT BREAST TISSUE EXPANDER OR POSSIBLE IMPLANT FOR BREAST RECONSTRUCTION;  Surgeon: Crissie Reese, MD;  Location: [facility];  Service: Plastics;  Laterality: Right;  . MASTECTOMY COMPLETE / SIMPLE W/ SENTINEL NODE BIOPSY Right 02/06/2015  . NIPPLE SPARING MASTECTOMY/SENTINAL LYMPH NODE BIOPSY/RECONSTRUCTION/PLACEMENT OF TISSUE EXPANDER Right 02/06/2015   Procedure: NIPPLE SPARING MASTECTOMY WITH SENTINAL LYMPH NODE BIOPSY;  Surgeon: Stark Klein, MD;  Location: [facility];  Service: General;  Laterality: Right;  . RECONSTRUCTION BREAST IMMEDIATE / DELAYED W/ TISSUE EXPANDER Right 02/06/2015  .  VAGINAL HYSTERECTOMY  1989  . WISDOM TOOTH EXTRACTION      Past Medical Hx:  Past Medical History:  Diagnosis Date  . Breast cancer ([place] [place])   . Breast cancer of lower-outer quadrant of right female breast ([facility]) 11/01/2014  . Constipation    takes Miralax  . Dysrhythmia   . H/O sinus tachycardia 2014   evaluated By Cardiologist, Dr Harrington Challenger  . Syncope    sudden onset    Past Gynecological History: Gravida 3 para 2-0-1-0 vaginal hysterectomy 1991 for uterine prolapse acute age 70 regular menses condom use for birth controls no history of abnormal Pap test  Family Hx:  Family History  Problem Relation Age of Onset  . Hyperlipidemia Father   . Hypertension Father   . Kidney failure Maternal Grandmother   . Diabetes Maternal Grandfather   . Stroke Maternal Grandfather   . Lung cancer Paternal Grandmother   . Heart attack Paternal Grandfather   . Heart disease Paternal Berton Bon grandfather with a vascular malignancy  Vitals:  Blood pressure 120/88, pulse 82, temperature 98.6 F (37 C), temperature source Oral, resp. rate 18, height 5\' 5"  (1.651 m), weight 125 lb 1.6 oz (56.7 kg), SpO2 100 %. Body mass index is 20.82 kg/m.   Physical Exam: WD in NAD Neck  Supple  NROM, without any enlargements.  Lymph Node Survey No cervical supraclavicular or inguinal adenopathy Cardiovascular  Pulse normal rate, regularity and rhythm.  Lungs  Clear to auscultation bilaterally, without wheezes/crackles/rhonchi. Good air movement.  Skin  No rash/lesions/breakdown  Psychiatry  Alert and oriented appropriate mood affect speech and reasoning. Abdomen  Normoactive bowel sounds, abdomen soft, non-tender.  Back No CVA tenderness Genito Urinary  Vulva/vagina: Normal external female genitalia.  No lesions. No discharge or bleeding.  Bladder/urethra:  No lesions or masses  Vagina: Atrophic cuff intact no nodules  Adnexa: Right-sided smooth approximately 20 cm adnexal mass minimally mobile  rectal  Good tone, no masses no cul de sac nodularity.  Neck mass palpable no rectovaginal septum nodularity Extremities  No bilateral cyanosis, clubbing or edema.   Janie Morning, MD, PhD 04/29/2018, 2:02 PM

## 2018-04-29 NOTE — Progress Notes (Signed)
Consult Note: Gyn-Onc  Consult was requested by nurse practitioner Tricia Thomas for the evaluation of Tricia Thomas 52 y.o. female  CC:  Chief Complaint  Patient presents with  . Pelvic mass    Assessment/Plan:  Tricia Thomas is a 52 y.o. with a unilateral presumed right and large adnexal mass measuring 18 cm with minimal mobility.  Ca1 25 has been ordered.  The plan is for a laparotomy with bilateral salpingo-oophorectomy and other indicated procedures which could include lymph node dissection omentectomy bowel resection.  Fair amount of time was spent discussing the length of the incision, and that our goal was to keep it as small as feasible until a diagnosis is been made.  Discussed that comanagement of the gallbladder polyp or the renal cysts would require a surgical approachthat was distinct from what is plan for management of this adnexal mass under such evaluation will need to be staged. If this is a gynecologic malignancy we discussed that omentectomy will require extension of the incision at minimum to the level of the umbilicus.  The risks of the procedure discussed included infection bleeding damage to surrounding structures prolonged hospitalization reoperation.  All of Tricia questions and those of Tricia Thomas were answered to their  Satisfaction  Will treat with a Z-Pak.  Surgical procedure is scheduled with Dr. Everitt Amber on May 04, 2018  HPI: Tricia Thomas is a 52 y.o. 2 para 2 who per the notes has reported 6 months of lower abdomen pelvis distention.  She does verbalize changes in urinary urgency over the last month.  It there is no weight loss no change in appetite no early satiety no nausea vomiting.  A CT of the abdomen and pelvis on April 28, 2018 is notable for a an 8 mm polypoid soft tissue density in the gallbladder consistent with a gallbladder polyp right kidney is noted to have a complex cystic lesion 5.7 x 4.0 with no thickening or nodular  septations mural soft tissue densities moderate right hydronephrosis due to the large pelvic mass.  Within the mass is a complex cystic mass in the central pelvis extending to the lower abdomen with measures 18.4 x 10.3 x 12.7 cm this mass contains numerous thin internal septations with contrast enhancement but no thickened septations or solid mural nodules are identified this mass causes compression of the right ureter and moderate right hydronephrosis.  Ca1 25 is not available for review.  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    Review of Systems:  Constitutional  Feels well, reports cough from sinus drip and yellow expectorant Cardiovascular  No chest pain, shortness of breath, or edema  Pulmonary  No cough or wheeze.  Gastro Intestinal  No nausea, vomitting, or diarrhoea. No bright red blood per rectum, no abdominal pain, change in bowel movement, or constipation.  No early satiety no weight loss no change in appetite Genito Urinary  No frequency, which 1 month of urinary urgency, no dysuria, no vaginal bleeding  musculo Skeletal  No myalgia, arthralgia, joint swelling or pain  Neurologic  No weakness, numbness, change in gait,  Psychology  No depression, anxiety, insomnia.    Current Meds:  Outpatient Encounter Medications as of 04/29/2018  Medication Sig  . calcium-vitamin D (OSCAL WITH D) 500-200 MG-UNIT per tablet  Take 2 tablets by mouth.  . Multiple Vitamin (MULTIVITAMIN) tablet Take 1 tablet by mouth daily.  . tamoxifen (NOLVADEX) 20 MG tablet TAKE 1 TABLET(20 MG) BY MOUTH DAILY  . varenicline (CHANTIX PAK) 0.5 MG X 11 & 1 MG X 42 tablet Take one 0.5 mg tablet by mouth once daily for 3 days, then increase to one 0.5 mg tablet twice daily for 4 days, then increase to one 1  mg tablet twice daily.  Marland Kitchen azithromycin (ZITHROMAX) 250 MG tablet Take 500 mg (2 tablets) once then 250 mg daily for 4 days   No facility-administered encounter medications on file as of 04/29/2018.     Allergy: No Known Allergies  Social Hx:   Social History   Socioeconomic History  . Marital status: Married    Spouse name: Not on file  . Number of children: Not on file  . Years of education: Not on file  . Highest education level: Not on file  Occupational History  . Not on file  Social Needs  . Financial resource strain: Not on file  . Food insecurity:    Worry: Not on file    Inability: Not on file  . Transportation needs:    Medical: Not on file    Non-medical: Not on file  Tobacco Use  . Smoking status: Former Smoker    Packs/day: 1.00    Years: 10.00    Pack years: 10.00    Types: Cigarettes    Last attempt to quit: 12/02/2014    Years since quitting: 3.4  . Smokeless tobacco: Never Used  Substance and Sexual Activity  . Alcohol use: No  . Drug use: No  . Sexual activity: Yes    Birth control/protection: Surgical  Lifestyle  . Physical activity:    Days per week: Not on file    Minutes per session: Not on file  . Stress: Not on file  Relationships  . Social connections:    Talks on phone: Not on file    Gets together: Not on file    Attends religious service: Not on file    Active member of club or organization: Not on file    Attends meetings of clubs or organizations: Not on file    Relationship status: Not on file  . Intimate partner violence:    Fear of current or ex partner: Not on file    Emotionally abused: Not on file    Physically abused: Not on file    Forced sexual activity: Not on file  Other Topics Concern  . Not on file  Social History Narrative  . Not on file    Past Surgical Hx:  Past Surgical History:  Procedure Laterality Date  . APPENDECTOMY  1990  . BREAST BIOPSY Bilateral    "I've had several on both sides"  . BREAST  RECONSTRUCTION WITH PLACEMENT OF TISSUE EXPANDER AND FLEX HD (ACELLULAR HYDRATED DERMIS) Right 02/06/2015   Procedure:  PLACEMENT OF RIGHT BREAST TISSUE EXPANDER OR POSSIBLE IMPLANT FOR BREAST RECONSTRUCTION;  Surgeon: Crissie Reese, MD;  Location: [facility];  Service: Plastics;  Laterality: Right;  . MASTECTOMY COMPLETE / SIMPLE W/ SENTINEL NODE BIOPSY Right 02/06/2015  . NIPPLE SPARING MASTECTOMY/SENTINAL LYMPH NODE BIOPSY/RECONSTRUCTION/PLACEMENT OF TISSUE EXPANDER Right 02/06/2015   Procedure: NIPPLE SPARING MASTECTOMY WITH SENTINAL LYMPH NODE BIOPSY;  Surgeon: Stark Klein, MD;  Location: [facility];  Service: General;  Laterality: Right;  . RECONSTRUCTION BREAST IMMEDIATE / DELAYED W/ TISSUE EXPANDER Right 02/06/2015  .  VAGINAL HYSTERECTOMY  1989  . WISDOM TOOTH EXTRACTION      Past Medical Hx:  Past Medical History:  Diagnosis Date  . Breast cancer ([place])   . Breast cancer of lower-outer quadrant of right female breast ([facility]) 11/01/2014  . Constipation    takes Miralax  . Dysrhythmia   . H/O sinus tachycardia 2014   evaluated By Cardiologist, Dr Harrington Challenger  . Syncope    sudden onset    Past Gynecological History: Gravida 3 para 2-0-1-0 vaginal hysterectomy 1991 for uterine prolapse acute age 29 regular menses condom use for birth controls no history of abnormal Pap test  Family Hx:  Family History  Problem Relation Age of Onset  . Hyperlipidemia Father   . Hypertension Father   . Kidney failure Maternal Grandmother   . Diabetes Maternal Grandfather   . Stroke Maternal Grandfather   . Lung cancer Paternal Grandmother   . Heart attack Paternal Grandfather   . Heart disease Paternal Berton Bon grandfather with a vascular malignancy  Vitals:  Blood pressure 120/88, pulse 82, temperature 98.6 F (37 C), temperature source Oral, resp. rate 18, height 5\' 5"  (1.651 m), weight 125 lb 1.6 oz (56.7 kg), SpO2 100 %. Body mass index is 20.82 kg/m.   Physical Exam: WD in NAD Neck  Supple  NROM, without any enlargements.  Lymph Node Survey No cervical supraclavicular or inguinal adenopathy Cardiovascular  Pulse normal rate, regularity and rhythm.  Lungs  Clear to auscultation bilaterally, without wheezes/crackles/rhonchi. Good air movement.  Skin  No rash/lesions/breakdown  Psychiatry  Alert and oriented appropriate mood affect speech and reasoning. Abdomen  Normoactive bowel sounds, abdomen soft, non-tender.  Back No CVA tenderness Genito Urinary  Vulva/vagina: Normal external female genitalia.  No lesions. No discharge or bleeding.  Bladder/urethra:  No lesions or masses  Vagina: Atrophic cuff intact no nodules  Adnexa: Right-sided smooth approximately 20 cm adnexal mass minimally mobile  rectal  Good tone, no masses no cul de sac nodularity.  Neck mass palpable no rectovaginal septum nodularity Extremities  No bilateral cyanosis, clubbing or edema.   Janie Morning, MD, PhD 04/29/2018, 2:02 PM

## 2018-04-30 ENCOUNTER — Telehealth: Payer: Self-pay | Admitting: Gynecologic Oncology

## 2018-04-30 LAB — CANCER ANTIGEN 27.29: CA 27.29: 21.7 U/mL (ref 0.0–38.6)

## 2018-04-30 LAB — CA 125: Cancer Antigen (CA) 125: 15.4 U/mL (ref 0.0–38.1)

## 2018-04-30 NOTE — Telephone Encounter (Signed)
Left message for patient with informing her that her tumor markers were normal.  Advised to call the office for any questions or concerns.

## 2018-05-03 ENCOUNTER — Encounter (HOSPITAL_COMMUNITY): Payer: Self-pay

## 2018-05-03 ENCOUNTER — Other Ambulatory Visit: Payer: Self-pay

## 2018-05-03 ENCOUNTER — Encounter (HOSPITAL_COMMUNITY)
Admission: RE | Admit: 2018-05-03 | Discharge: 2018-05-03 | Disposition: A | Discharge status: Home / Self Care | Payer: Commercial Managed Care - PPO | Source: Ambulatory Visit | Attending: Gynecologic Oncology | Admitting: Gynecologic Oncology

## 2018-05-03 DIAGNOSIS — Z01812 Encounter for preprocedural laboratory examination: Secondary | ICD-10-CM | POA: Insufficient documentation

## 2018-05-03 DIAGNOSIS — Z923 Personal history of irradiation: Secondary | ICD-10-CM | POA: Diagnosis not present

## 2018-05-03 DIAGNOSIS — Z79810 Long term (current) use of selective estrogen receptor modulators (SERMs): Secondary | ICD-10-CM | POA: Diagnosis not present

## 2018-05-03 DIAGNOSIS — N8302 Follicular cyst of left ovary: Secondary | ICD-10-CM | POA: Diagnosis not present

## 2018-05-03 DIAGNOSIS — Z853 Personal history of malignant neoplasm of breast: Secondary | ICD-10-CM | POA: Diagnosis not present

## 2018-05-03 DIAGNOSIS — D270 Benign neoplasm of right ovary: Secondary | ICD-10-CM | POA: Diagnosis not present

## 2018-05-03 DIAGNOSIS — N83201 Unspecified ovarian cyst, right side: Secondary | ICD-10-CM | POA: Diagnosis present

## 2018-05-03 DIAGNOSIS — Z87891 Personal history of nicotine dependence: Secondary | ICD-10-CM | POA: Diagnosis not present

## 2018-05-03 HISTORY — DX: Other specified postprocedural states: Z98.890

## 2018-05-03 HISTORY — DX: Other specified postprocedural states: R11.2

## 2018-05-03 LAB — URINALYSIS, ROUTINE W REFLEX MICROSCOPIC
BILIRUBIN URINE: NEGATIVE
Glucose, UA: NEGATIVE mg/dL
Ketones, ur: 20 mg/dL — AB
Nitrite: NEGATIVE
PH: 7 (ref 5.0–8.0)
Protein, ur: NEGATIVE mg/dL
SPECIFIC GRAVITY, URINE: 1.005 (ref 1.005–1.030)

## 2018-05-03 LAB — ABO/RH: ABO/RH(D): B POS

## 2018-05-03 NOTE — Patient Instructions (Addendum)
Tricia Thomas  05/03/2018   Your procedure is scheduled on: 05-04-18   Report to [facility] Main  Entrance    Report to admitting at 11:45AM    Call this number if you have problems the morning of surgery [phone]    Remember: Eat a light diet the day before surgery. Examples including soups, broths, toast, yogurt, mashed potatoes. Things to avoid include carbonated beverages (fizzy beverages), raw fruits and raw vegetables, or beans. Starting at 4pm the day before surgery, begin drinking two bottles of magnesium citrate and only take in clear liquids after that time. Only clear liquids after midnight. Nothing by mouth 3 hours prior to scheduled surgery. Please finish carbohydrate drink 3 hours prior to surgery at 11:15AM. BRUSH YOUR TEETH MORNING OF SURGERY AND RINSE YOUR MOUTH OUT, NO CHEWING GUM CANDY OR MINTS.      CLEAR LIQUID DIET   Foods Allowed                                                                     Foods Excluded  Coffee and tea, regular and decaf                             liquids that you cannot  Plain Jell-O in any flavor                                             see through such as: Fruit ices (not with fruit pulp)                                     milk, soups, orange juice  Iced Popsicles                                    All solid food Carbonated beverages, regular and diet                                    Cranberry, grape and apple juices Sports drinks like Gatorade Lightly seasoned clear broth or consume(fat free) Sugar, honey syrup  Sample Menu Breakfast                                Lunch                                     Supper Cranberry juice                    Beef broth  Chicken broth Jell-O                                     Grape juice                           Apple juice Coffee or tea                        Jell-O                                      Popsicle                                                 Coffee or tea                        Coffee or tea  _____________________________________________________________________       Take these medicines the morning of surgery with A SIP OF WATER:  tamoxifen                                 You may not have any metal on your body including hair pins and              piercings  Do not wear jewelry, make-up, lotions, powders or perfumes, deodorant             Do not wear nail polish.  Do not shave  48 hours prior to surgery.              Do not bring valuables to the hospital. [place].  Contacts, dentures or bridgework may not be worn into surgery.  Leave suitcase in the car. After surgery it may be brought to your room.                 Please read over the following fact sheets you were given: _____________________________________________________________________             [facility] - Preparing for Surgery Before surgery, you can play an important role.  Because skin is not sterile, your skin needs to be as free of germs as possible.  You can reduce the number of germs on your skin by washing with CHG (chlorahexidine gluconate) soap before surgery.  CHG is an antiseptic cleaner which kills germs and bonds with the skin to continue killing germs even after washing. Please DO NOT use if you have an allergy to CHG or antibacterial soaps.  If your skin becomes reddened/irritated stop using the CHG and inform your nurse when you arrive at Short Stay. Do not shave (including legs and underarms) for at least 48 hours prior to the first CHG shower.  You may shave your face/neck. Please follow these instructions carefully:  1.  Shower with CHG Soap the night before surgery and the  morning of Surgery.  2.  If you choose to wash your hair, wash your  hair first as usual with your  normal  shampoo.  3.  After you shampoo, rinse your hair and body thoroughly to  remove the  shampoo.                           4.  Use CHG as you would any other liquid soap.  You can apply chg directly  to the skin and wash                       Gently with a scrungie or clean washcloth.  5.  Apply the CHG Soap to your body ONLY FROM THE NECK DOWN.   Do not use on face/ open                           Wound or open sores. Avoid contact with eyes, ears mouth and genitals (private parts).                       Wash face,  Genitals (private parts) with your normal soap.             6.  Wash thoroughly, paying special attention to the area where your surgery  will be performed.  7.  Thoroughly rinse your body with warm water from the neck down.  8.  DO NOT shower/wash with your normal soap after using and rinsing off  the CHG Soap.                9.  Pat yourself dry with a clean towel.            10.  Wear clean pajamas.            11.  Place clean sheets on your bed the night of your first shower and do not  sleep with pets. Day of Surgery : Do not apply any lotions/deodorants the morning of surgery.  Please wear clean clothes to the hospital/surgery center.  FAILURE TO FOLLOW THESE INSTRUCTIONS MAY RESULT IN THE CANCELLATION OF YOUR SURGERY PATIENT SIGNATURE_________________________________  NURSE SIGNATURE__________________________________  ________________________________________________________________________   Tricia Thomas  An incentive spirometer is a tool that can help keep your lungs clear and active. This tool measures how well you are filling your lungs with each breath. Taking long deep breaths may help reverse or decrease the chance of developing breathing (pulmonary) problems (especially infection) following:  A long period of time when you are unable to move or be active. BEFORE THE PROCEDURE   If the spirometer includes an indicator to show your best effort, your nurse or respiratory therapist will set it to a desired goal.  If possible, sit  up straight or lean slightly forward. Try not to slouch.  Hold the incentive spirometer in an upright position. INSTRUCTIONS FOR USE  1. Sit on the edge of your bed if possible, or sit up as far as you can in bed or on a chair. 2. Hold the incentive spirometer in an upright position. 3. Breathe out normally. 4. Place the mouthpiece in your mouth and seal your lips tightly around it. 5. Breathe in slowly and as deeply as possible, raising the piston or the ball toward the top of the column. 6. Hold your breath for 3-5 seconds or for as long as possible. Allow the piston or ball to fall to the bottom of the column. 7.  Remove the mouthpiece from your mouth and breathe out normally. 8. Rest for a few seconds and repeat Steps 1 through 7 at least 10 times every 1-2 hours when you are awake. Take your time and take a few normal breaths between deep breaths. 9. The spirometer may include an indicator to show your best effort. Use the indicator as a goal to work toward during each repetition. 10. After each set of 10 deep breaths, practice coughing to be sure your lungs are clear. If you have an incision (the cut made at the time of surgery), support your incision when coughing by placing a pillow or rolled up towels firmly against it. Once you are able to get out of bed, walk around indoors and cough well. You may stop using the incentive spirometer when instructed by your caregiver.  RISKS AND COMPLICATIONS  Take your time so you do not get dizzy or light-headed.  If you are in pain, you may need to take or ask for pain medication before doing incentive spirometry. It is harder to take a deep breath if you are having pain. AFTER USE  Rest and breathe slowly and easily.  It can be helpful to keep track of a log of your progress. Your caregiver can provide you with a simple table to help with this. If you are using the spirometer at home, follow these instructions: [place] IF:   You are  having difficultly using the spirometer.  You have trouble using the spirometer as often as instructed.  Your pain medication is not giving enough relief while using the spirometer.  You develop fever of 100.5 F (38.1 C) or higher. SEEK IMMEDIATE MEDICAL CARE IF:   You cough up bloody sputum that had not been present before.  You develop fever of 102 F (38.9 C) or greater.  You develop worsening pain at or near the incision site. MAKE SURE YOU:   Understand these instructions.  Will watch your condition.  Will get help right away if you are not doing well or get worse. Document Released: 11/24/2006 Document Revised: 10/06/2011 Document Reviewed: 01/25/2007 Exit[employer] Patient Information 2014 Exit[employer], [place].   ________________________________________________________________________  WHAT IS A BLOOD TRANSFUSION? Blood Transfusion Information  A transfusion is the replacement of blood or some of its parts. Blood is made up of multiple cells which provide different functions.  Red blood cells carry oxygen and are used for blood loss replacement.  White blood cells fight against infection.  Platelets control bleeding.  Plasma helps clot blood.  Other blood products are available for specialized needs, such as hemophilia or other clotting disorders. BEFORE THE TRANSFUSION  Who gives blood for transfusions?   Healthy volunteers who are fully evaluated to make sure their blood is safe. This is blood bank blood. Transfusion therapy is the safest it has ever been in the practice of medicine. Before blood is taken from a donor, a complete history is taken to make sure that person has no history of diseases nor engages in risky social behavior (examples are intravenous drug use or sexual activity with multiple partners). The donor's travel history is screened to minimize risk of transmitting infections, such as malaria. The donated blood is tested for signs of infectious diseases,  such as HIV and hepatitis. The blood is then tested to be sure it is compatible with you in order to minimize the chance of a transfusion reaction. If you or a relative donates blood, this is often done in anticipation  of surgery and is not appropriate for emergency situations. It takes many days to process the donated blood. RISKS AND COMPLICATIONS Although transfusion therapy is very safe and saves many lives, the main dangers of transfusion include:   Getting an infectious disease.  Developing a transfusion reaction. This is an allergic reaction to something in the blood you were given. Every precaution is taken to prevent this. The decision to have a blood transfusion has been considered carefully by your caregiver before blood is given. Blood is not given unless the benefits outweigh the risks. AFTER THE TRANSFUSION  Right after receiving a blood transfusion, you will usually feel much better and more energetic. This is especially true if your red blood cells have gotten low (anemic). The transfusion raises the level of the red blood cells which carry oxygen, and this usually causes an energy increase.  The nurse administering the transfusion will monitor you carefully for complications. HOME CARE INSTRUCTIONS  No special instructions are needed after a transfusion. You may find your energy is better. Speak with your caregiver about any limitations on activity for underlying diseases you may have. SEEK MEDICAL CARE IF:   Your condition is not improving after your transfusion.  You develop redness or irritation at the intravenous (IV) site. SEEK IMMEDIATE MEDICAL CARE IF:  Any of the following symptoms occur over the next 12 hours:  Shaking chills.  You have a temperature by mouth above 102 F (38.9 C), not controlled by medicine.  Chest, back, or muscle pain.  People around you feel you are not acting correctly or are confused.  Shortness of breath or difficulty  breathing.  Dizziness and fainting.  You get a rash or develop hives.  You have a decrease in urine output.  Your urine turns a dark color or changes to pink, red, or brown. Any of the following symptoms occur over the next 10 days:  You have a temperature by mouth above 102 F (38.9 C), not controlled by medicine.  Shortness of breath.  Weakness after normal activity.  The white part of the eye turns yellow (jaundice).  You have a decrease in the amount of urine or are urinating less often.  Your urine turns a dark color or changes to pink, red, or brown. Document Released: 07/11/2000 Document Revised: 10/06/2011 Document Reviewed: 02/28/2008 [facility] Patient Information 2014 [place], [place].  _______________________________________________________________________

## 2018-05-03 NOTE — Progress Notes (Signed)
Cbcdiff, cmp 04-27-18 epic

## 2018-05-04 ENCOUNTER — Observation Stay (HOSPITAL_COMMUNITY)
Admission: RE | Admit: 2018-05-04 | Discharge: 2018-05-05 | Disposition: A | Discharge status: Home / Self Care | Payer: Commercial Managed Care - PPO | Source: Ambulatory Visit | Attending: Gynecologic Oncology | Admitting: Gynecologic Oncology

## 2018-05-04 ENCOUNTER — Other Ambulatory Visit: Payer: Self-pay

## 2018-05-04 ENCOUNTER — Encounter (HOSPITAL_COMMUNITY): Payer: Self-pay | Admitting: *Deleted

## 2018-05-04 ENCOUNTER — Ambulatory Visit (HOSPITAL_COMMUNITY): Payer: Commercial Managed Care - PPO | Admitting: Anesthesiology

## 2018-05-04 ENCOUNTER — Encounter (HOSPITAL_COMMUNITY)
Admission: RE | Disposition: A | Discharge status: Home / Self Care | Payer: Self-pay | Source: Ambulatory Visit | Attending: Gynecologic Oncology

## 2018-05-04 DIAGNOSIS — Z79810 Long term (current) use of selective estrogen receptor modulators (SERMs): Secondary | ICD-10-CM | POA: Insufficient documentation

## 2018-05-04 DIAGNOSIS — N8302 Follicular cyst of left ovary: Secondary | ICD-10-CM | POA: Insufficient documentation

## 2018-05-04 DIAGNOSIS — D270 Benign neoplasm of right ovary: Secondary | ICD-10-CM | POA: Diagnosis not present

## 2018-05-04 DIAGNOSIS — R1900 Intra-abdominal and pelvic swelling, mass and lump, unspecified site: Secondary | ICD-10-CM | POA: Diagnosis present

## 2018-05-04 DIAGNOSIS — Z87891 Personal history of nicotine dependence: Secondary | ICD-10-CM | POA: Insufficient documentation

## 2018-05-04 DIAGNOSIS — N83209 Unspecified ovarian cyst, unspecified side: Secondary | ICD-10-CM

## 2018-05-04 DIAGNOSIS — N83201 Unspecified ovarian cyst, right side: Secondary | ICD-10-CM | POA: Diagnosis not present

## 2018-05-04 DIAGNOSIS — Z923 Personal history of irradiation: Secondary | ICD-10-CM | POA: Insufficient documentation

## 2018-05-04 DIAGNOSIS — Z853 Personal history of malignant neoplasm of breast: Secondary | ICD-10-CM | POA: Insufficient documentation

## 2018-05-04 HISTORY — PX: LAPAROTOMY: SHX154

## 2018-05-04 HISTORY — PX: SALPINGOOPHORECTOMY: SHX82

## 2018-05-04 LAB — COMPREHENSIVE METABOLIC PANEL
ALT: 14 U/L (ref 0–44)
AST: 25 U/L (ref 15–41)
Albumin: 4 g/dL (ref 3.5–5.0)
Alkaline Phosphatase: 49 U/L (ref 38–126)
Anion gap: 9 (ref 5–15)
BUN: 10 mg/dL (ref 6–20)
CHLORIDE: 106 mmol/L (ref 98–111)
CO2: 27 mmol/L (ref 22–32)
CREATININE: 1.01 mg/dL — AB (ref 0.44–1.00)
Calcium: 8.6 mg/dL — ABNORMAL LOW (ref 8.9–10.3)
GFR calc Af Amer: >60 mL/min (ref >60)
Glucose, Bld: 119 mg/dL — ABNORMAL HIGH (ref 70–99)
POTASSIUM: 3.1 mmol/L — AB (ref 3.5–5.1)
SODIUM: 142 mmol/L (ref 135–145)
Total Bilirubin: 0.7 mg/dL (ref 0.3–1.2)
Total Protein: 6.8 g/dL (ref 6.5–8.1)

## 2018-05-04 LAB — CBC
HCT: 38.2 % (ref 36.0–46.0)
HEMOGLOBIN: 12.4 g/dL (ref 12.0–15.0)
MCH: 31 pg (ref 26.0–34.0)
MCHC: 32.5 g/dL (ref 30.0–36.0)
MCV: 95.5 fL (ref 80.0–100.0)
Platelets: 281 10*3/uL (ref 150–400)
RBC: 4 MIL/uL (ref 3.87–5.11)
RDW: 13.3 % (ref 11.5–15.5)
WBC: 9.1 10*3/uL (ref 4.0–10.5)
nRBC: 0 % (ref 0.0–0.2)

## 2018-05-04 LAB — TYPE AND SCREEN
ABO/RH(D): B POS
Antibody Screen: NEGATIVE

## 2018-05-04 SURGERY — LAPAROTOMY, EXPLORATORY
Anesthesia: General

## 2018-05-04 MED ORDER — LIDOCAINE HCL (CARDIAC) PF 100 MG/5ML IV SOSY
PREFILLED_SYRINGE | INTRAVENOUS | Status: AC
  Filled 2018-05-04: qty 5

## 2018-05-04 MED ORDER — DEXAMETHASONE SODIUM PHOSPHATE 4 MG/ML IJ SOLN: 4.0000 mg | INTRAMUSCULAR | Status: DC

## 2018-05-04 MED ORDER — LIDOCAINE 2% (20 MG/ML) 5 ML SYRINGE
INTRAMUSCULAR | Status: DC | PRN
  Administered 2018-05-04: 100 mg via INTRAVENOUS

## 2018-05-04 MED ORDER — 0.9 % SODIUM CHLORIDE (POUR BTL) OPTIME
TOPICAL | Status: DC | PRN
  Administered 2018-05-04: 3000 mL

## 2018-05-04 MED ORDER — SUGAMMADEX SODIUM 200 MG/2ML IV SOLN
INTRAVENOUS | Status: DC | PRN
  Administered 2018-05-04: 200 mg via INTRAVENOUS

## 2018-05-04 MED ORDER — ONDANSETRON HCL 4 MG/2ML IJ SOLN
INTRAMUSCULAR | Status: DC | PRN
  Administered 2018-05-04 (×2): 4 mg via INTRAVENOUS

## 2018-05-04 MED ORDER — OXYCODONE HCL 5 MG PO TABS: 5.0000 mg | ORAL_TABLET | Freq: Once | ORAL | Status: DC | PRN

## 2018-05-04 MED ORDER — CELECOXIB 200 MG PO CAPS
400.0000 mg | ORAL_CAPSULE | ORAL | Status: AC
  Administered 2018-05-04: 400 mg via ORAL
  Filled 2018-05-04: qty 2

## 2018-05-04 MED ORDER — KETOROLAC TROMETHAMINE 30 MG/ML IJ SOLN: 30.0000 mg | Freq: Four times a day (QID) | INTRAMUSCULAR | Status: DC | PRN

## 2018-05-04 MED ORDER — OXYCODONE HCL 5 MG/5ML PO SOLN
5.0000 mg | Freq: Once | ORAL | Status: DC | PRN
  Filled 2018-05-04: qty 5

## 2018-05-04 MED ORDER — ENOXAPARIN SODIUM 40 MG/0.4ML ~~LOC~~ SOLN
40.0000 mg | SUBCUTANEOUS | Status: DC
  Administered 2018-05-05: 40 mg via SUBCUTANEOUS
  Filled 2018-05-04: qty 0.4

## 2018-05-04 MED ORDER — MIDAZOLAM HCL 2 MG/2ML IJ SOLN
INTRAMUSCULAR | Status: DC | PRN
  Administered 2018-05-04: 2 mg via INTRAVENOUS

## 2018-05-04 MED ORDER — ACETAMINOPHEN 500 MG PO TABS
1000.0000 mg | ORAL_TABLET | Freq: Four times a day (QID) | ORAL | Status: DC
  Administered 2018-05-04 – 2018-05-05 (×3): 1000 mg via ORAL
  Filled 2018-05-04 (×4): qty 2

## 2018-05-04 MED ORDER — SODIUM CHLORIDE 0.9 % IV SOLN
2.0000 g | INTRAVENOUS | Status: AC
  Administered 2018-05-04: 2 g via INTRAVENOUS
  Filled 2018-05-04: qty 2

## 2018-05-04 MED ORDER — POTASSIUM CHLORIDE IN NACL 20-0.9 MEQ/L-% IV SOLN
INTRAVENOUS | Status: DC
  Administered 2018-05-04: 18:00:00 via INTRAVENOUS
  Filled 2018-05-04: qty 1000

## 2018-05-04 MED ORDER — FENTANYL CITRATE (PF) 100 MCG/2ML IJ SOLN
INTRAMUSCULAR | Status: AC
  Filled 2018-05-04: qty 2

## 2018-05-04 MED ORDER — BUPIVACAINE HCL (PF) 0.25 % IJ SOLN
INTRAMUSCULAR | Status: AC
  Filled 2018-05-04: qty 30

## 2018-05-04 MED ORDER — GLYCOPYRROLATE PF 0.2 MG/ML IJ SOSY
PREFILLED_SYRINGE | INTRAMUSCULAR | Status: AC
  Filled 2018-05-04: qty 1

## 2018-05-04 MED ORDER — DEXAMETHASONE SODIUM PHOSPHATE 10 MG/ML IJ SOLN
INTRAMUSCULAR | Status: AC
  Filled 2018-05-04: qty 1

## 2018-05-04 MED ORDER — SODIUM CHLORIDE 0.9 % IJ SOLN
INTRAMUSCULAR | Status: AC
  Filled 2018-05-04: qty 20

## 2018-05-04 MED ORDER — ACETAMINOPHEN 500 MG PO TABS
1000.0000 mg | ORAL_TABLET | ORAL | Status: AC
  Administered 2018-05-04: 1000 mg via ORAL
  Filled 2018-05-04: qty 2

## 2018-05-04 MED ORDER — ENOXAPARIN SODIUM 40 MG/0.4ML ~~LOC~~ SOLN
40.0000 mg | SUBCUTANEOUS | Status: AC
  Administered 2018-05-04: 40 mg via SUBCUTANEOUS
  Filled 2018-05-04: qty 0.4

## 2018-05-04 MED ORDER — GABAPENTIN 300 MG PO CAPS
300.0000 mg | ORAL_CAPSULE | ORAL | Status: AC
  Administered 2018-05-04: 300 mg via ORAL
  Filled 2018-05-04: qty 1

## 2018-05-04 MED ORDER — ROCURONIUM BROMIDE 100 MG/10ML IV SOLN
INTRAVENOUS | Status: DC | PRN
  Administered 2018-05-04: 50 mg via INTRAVENOUS

## 2018-05-04 MED ORDER — LACTATED RINGERS IV SOLN
INTRAVENOUS | Status: DC
  Administered 2018-05-04: 12:00:00 via INTRAVENOUS

## 2018-05-04 MED ORDER — BUPIVACAINE LIPOSOME 1.3 % IJ SUSP
20.0000 mL | Freq: Once | INTRAMUSCULAR | Status: AC
  Administered 2018-05-04: 20 mL
  Filled 2018-05-04: qty 20

## 2018-05-04 MED ORDER — FENTANYL CITRATE (PF) 100 MCG/2ML IJ SOLN
INTRAMUSCULAR | Status: DC | PRN
  Administered 2018-05-04 (×4): 50 ug via INTRAVENOUS

## 2018-05-04 MED ORDER — PROPOFOL 10 MG/ML IV BOLUS
INTRAVENOUS | Status: DC | PRN
  Administered 2018-05-04: 140 mg via INTRAVENOUS

## 2018-05-04 MED ORDER — SCOPOLAMINE 1 MG/3DAYS TD PT72
1.0000 | MEDICATED_PATCH | TRANSDERMAL | Status: DC
  Administered 2018-05-04: 1.5 mg via TRANSDERMAL
  Filled 2018-05-04: qty 1

## 2018-05-04 MED ORDER — ROCURONIUM BROMIDE 100 MG/10ML IV SOLN
INTRAVENOUS | Status: AC
  Filled 2018-05-04: qty 1

## 2018-05-04 MED ORDER — PROPOFOL 10 MG/ML IV BOLUS
INTRAVENOUS | Status: AC
  Filled 2018-05-04: qty 20

## 2018-05-04 MED ORDER — SUGAMMADEX SODIUM 200 MG/2ML IV SOLN
INTRAVENOUS | Status: AC
  Filled 2018-05-04: qty 2

## 2018-05-04 MED ORDER — GLYCOPYRROLATE PF 0.2 MG/ML IJ SOSY
PREFILLED_SYRINGE | INTRAMUSCULAR | Status: DC | PRN
  Administered 2018-05-04: .2 mg via INTRAVENOUS

## 2018-05-04 MED ORDER — ONDANSETRON HCL 4 MG/2ML IJ SOLN: 4.0000 mg | Freq: Four times a day (QID) | INTRAMUSCULAR | Status: DC | PRN

## 2018-05-04 MED ORDER — EPHEDRINE SULFATE-NACL 50-0.9 MG/10ML-% IV SOSY
PREFILLED_SYRINGE | INTRAVENOUS | Status: DC | PRN
  Administered 2018-05-04: 5 mg via INTRAVENOUS

## 2018-05-04 MED ORDER — OXYCODONE HCL 5 MG PO TABS
5.0000 mg | ORAL_TABLET | ORAL | Status: DC | PRN
  Administered 2018-05-05: 5 mg via ORAL
  Administered 2018-05-05: 10 mg via ORAL
  Filled 2018-05-04: qty 2
  Filled 2018-05-04: qty 1

## 2018-05-04 MED ORDER — MIDAZOLAM HCL 2 MG/2ML IJ SOLN
INTRAMUSCULAR | Status: AC
  Filled 2018-05-04: qty 2

## 2018-05-04 MED ORDER — HYDROMORPHONE HCL 1 MG/ML IJ SOLN: 0.5000 mg | INTRAMUSCULAR | Status: DC | PRN

## 2018-05-04 MED ORDER — ONDANSETRON HCL 4 MG PO TABS: 4.0000 mg | ORAL_TABLET | Freq: Four times a day (QID) | ORAL | Status: DC | PRN

## 2018-05-04 MED ORDER — ONDANSETRON HCL 4 MG/2ML IJ SOLN: 4.0000 mg | Freq: Once | INTRAMUSCULAR | Status: DC | PRN

## 2018-05-04 MED ORDER — ONDANSETRON HCL 4 MG/2ML IJ SOLN
INTRAMUSCULAR | Status: AC
  Filled 2018-05-04: qty 2

## 2018-05-04 MED ORDER — FENTANYL CITRATE (PF) 100 MCG/2ML IJ SOLN
25.0000 ug | INTRAMUSCULAR | Status: DC | PRN
  Administered 2018-05-04: 50 ug via INTRAVENOUS

## 2018-05-04 MED ORDER — DEXAMETHASONE SODIUM PHOSPHATE 10 MG/ML IJ SOLN
INTRAMUSCULAR | Status: DC | PRN
  Administered 2018-05-04 (×2): 10 mg via INTRAVENOUS

## 2018-05-04 MED ORDER — PROMETHAZINE HCL 25 MG/ML IJ SOLN: 6.2500 mg | INTRAMUSCULAR | Status: DC | PRN

## 2018-05-04 MED ORDER — VARENICLINE TARTRATE 0.5 MG X 11 & 1 MG X 42 PO MISC
0.5000 | Freq: Two times a day (BID) | ORAL | Status: DC
  Administered 2018-05-04 – 2018-05-05 (×2): 0.5 via ORAL
  Filled 2018-05-04 (×2): qty 1

## 2018-05-04 MED ORDER — BUPIVACAINE HCL (PF) 0.25 % IJ SOLN
INTRAMUSCULAR | Status: DC | PRN
  Administered 2018-05-04: 10 mL

## 2018-05-04 MED ORDER — EPHEDRINE 5 MG/ML INJ
INTRAVENOUS | Status: AC
  Filled 2018-05-04: qty 10

## 2018-05-04 SURGICAL SUPPLY — 58 items
ATTRACTOMAT 16X20 MAGNETIC DRP (DRAPES) ×3 IMPLANT
BLADE EXTENDED COATED 6.5IN (ELECTRODE) ×3 IMPLANT
CELLS DAT CNTRL 66122 CELL SVR (MISCELLANEOUS) ×2 IMPLANT
CHLORAPREP W/TINT 26ML (MISCELLANEOUS) ×3 IMPLANT
CLIP VESOCCLUDE LG 6/CT (CLIP) ×3 IMPLANT
CLIP VESOCCLUDE MED 6/CT (CLIP) ×6 IMPLANT
CLIP VESOCCLUDE MED LG 6/CT (CLIP) IMPLANT
CONT SPEC 4OZ CLIKSEAL STRL BL (MISCELLANEOUS) ×3 IMPLANT
DERMABOND ADVANCED (GAUZE/BANDAGES/DRESSINGS)
DERMABOND ADVANCED .7 DNX12 (GAUZE/BANDAGES/DRESSINGS) IMPLANT
DRAPE INCISE IOBAN 66X45 STRL (DRAPES) IMPLANT
DRAPE WARM FLUID 44X44 (DRAPE) ×3 IMPLANT
DRSG OPSITE POSTOP 4X10 (GAUZE/BANDAGES/DRESSINGS) IMPLANT
DRSG OPSITE POSTOP 4X6 (GAUZE/BANDAGES/DRESSINGS) ×3 IMPLANT
DRSG OPSITE POSTOP 4X8 (GAUZE/BANDAGES/DRESSINGS) IMPLANT
ELECT REM PT RETURN 15FT ADLT (MISCELLANEOUS) ×3 IMPLANT
GAUZE 4X4 16PLY RFD (DISPOSABLE) IMPLANT
GLOVE BIO SURGEON STRL SZ 6 (GLOVE) ×6 IMPLANT
GLOVE BIO SURGEON STRL SZ 6.5 (GLOVE) ×6 IMPLANT
GOWN STRL REUS W/ TWL LRG LVL3 (GOWN DISPOSABLE) ×4 IMPLANT
GOWN STRL REUS W/TWL LRG LVL3 (GOWN DISPOSABLE) ×2
HEMOSTAT ARISTA ABSORB 3G PWDR (MISCELLANEOUS) IMPLANT
KIT BASIN OR (CUSTOM PROCEDURE TRAY) ×3 IMPLANT
LIGASURE IMPACT 36 18CM CVD LR (INSTRUMENTS) IMPLANT
LOOP VESSEL MAXI BLUE (MISCELLANEOUS) IMPLANT
NEEDLE HYPO 22GX1.5 SAFETY (NEEDLE) ×6 IMPLANT
NS IRRIG 1000ML POUR BTL (IV SOLUTION) ×6 IMPLANT
PACK GENERAL/GYN (CUSTOM PROCEDURE TRAY) ×3 IMPLANT
RELOAD PROXIMATE 75MM BLUE (ENDOMECHANICALS) IMPLANT
RELOAD PROXIMATE TA60MM BLUE (ENDOMECHANICALS) IMPLANT
RETRACTOR WND ALEXIS 25 LRG (MISCELLANEOUS) IMPLANT
RTRCTR WOUND ALEXIS 18CM MED (MISCELLANEOUS) ×3
RTRCTR WOUND ALEXIS 25CM LRG (MISCELLANEOUS)
SHEET LAVH (DRAPES) ×3 IMPLANT
SPOGE SURGIFLO 8M (HEMOSTASIS)
SPONGE LAP 18X18 RF (DISPOSABLE) IMPLANT
SPONGE SURGIFLO 8M (HEMOSTASIS) IMPLANT
STAPLER GUN LINEAR PROX 60 (STAPLE) IMPLANT
STAPLER PROXIMATE 75MM BLUE (STAPLE) IMPLANT
STAPLER VISISTAT 35W (STAPLE) IMPLANT
SUCTION POOLE TIP (SUCTIONS) ×3 IMPLANT
SUT MNCRL AB 4-0 PS2 18 (SUTURE) ×6 IMPLANT
SUT PDS AB 1 TP1 96 (SUTURE) ×6 IMPLANT
SUT SILK 3 0 SH CR/8 (SUTURE) IMPLANT
SUT VIC AB 0 CT1 36 (SUTURE) IMPLANT
SUT VIC AB 2-0 CT1 36 (SUTURE) ×12 IMPLANT
SUT VIC AB 2-0 CT2 27 (SUTURE) ×9 IMPLANT
SUT VIC AB 2-0 SH 27 (SUTURE)
SUT VIC AB 2-0 SH 27X BRD (SUTURE) IMPLANT
SUT VIC AB 3-0 CTX 36 (SUTURE) IMPLANT
SUT VIC AB 3-0 SH 18 (SUTURE) IMPLANT
SUT VIC AB 3-0 SH 27 (SUTURE) ×2
SUT VIC AB 3-0 SH 27X BRD (SUTURE) ×4 IMPLANT
SYR 30ML LL (SYRINGE) ×6 IMPLANT
TOWEL OR 17X26 10 PK STRL BLUE (TOWEL DISPOSABLE) ×3 IMPLANT
TOWEL OR NON WOVEN STRL DISP B (DISPOSABLE) ×3 IMPLANT
TRAY FOLEY MTR SLVR 16FR STAT (SET/KITS/TRAYS/PACK) ×3 IMPLANT
UNDERPAD 30X30 (UNDERPADS AND DIAPERS) ×3 IMPLANT

## 2018-05-04 NOTE — Anesthesia Procedure Notes (Signed)
Procedure Name: Intubation Date/Time: 05/04/2018 1:28 PM Performed by: Dione Booze, CRNA Pre-anesthesia Checklist: Suction available, Patient being monitored, Emergency Drugs available and Patient identified Patient Re-evaluated:Patient Re-evaluated prior to induction Oxygen Delivery Method: Circle system utilized Preoxygenation: Pre-oxygenation with 100% oxygen Induction Type: IV induction Ventilation: Mask ventilation without difficulty Laryngoscope Size: Mac and 4 Grade View: Grade I Tube type: Oral Tube size: 7.5 mm Number of attempts: 1 Airway Equipment and Method: Stylet Placement Confirmation: ETT inserted through vocal cords under direct vision,  positive ETCO2 and breath sounds checked- equal and bilateral Secured at: 21 cm Tube secured with: Tape Dental Injury: Teeth and Oropharynx as per pre-operative assessment

## 2018-05-04 NOTE — Interval H&P Note (Signed)
History and Physical Interval Note:  05/04/2018 1:07 PM  Tricia Thomas  has presented today for surgery, with the diagnosis of PELVIC MASS  The various methods of treatment have been discussed with the patient and family. After consideration of risks, benefits and other options for treatment, the patient has consented to  Procedure(s): EXPLORATORY LAPAROTOMY WITH POSSIBLE STAGING (N/A) BILATERAL SALPINGO OOPHORECTOMY (Bilateral) POSSIBLE SMALL BOWEL RESECTION (N/A) as a surgical intervention .  The patient's history has been reviewed, patient examined, no change in status, stable for surgery.  I have reviewed the patient's chart and labs.  Questions were answered to the patient's satisfaction.     Thereasa Solo

## 2018-05-04 NOTE — Transfer of Care (Signed)
Immediate Anesthesia Transfer of Care Note  Patient: Tricia Thomas  Procedure(s) Performed: EXPLORATORY LAPAROTOMY (N/A ) BILATERAL SALPINGO OOPHORECTOMY (Bilateral )  Patient Location: PACU  Anesthesia Type:General  Level of Consciousness: drowsy and patient cooperative  Airway & Oxygen Therapy: Patient Spontanous Breathing and Patient connected to face mask oxygen  Post-op Assessment: Report given to RN and Post -op Vital signs reviewed and stable  Post vital signs: Reviewed and stable  Last Vitals:  Vitals Value Taken Time  BP 98/66 05/04/2018  3:06 PM  Temp 36.4 C 05/04/2018  3:06 PM  Pulse 68 05/04/2018  3:07 PM  Resp 17 05/04/2018  3:07 PM  SpO2 100 % 05/04/2018  3:07 PM  Vitals shown include unvalidated device data.  Last Pain:  Vitals:   05/04/18 1215  PainSc: 0-No pain         Complications: No apparent anesthesia complications

## 2018-05-04 NOTE — Op Note (Signed)
OPERATIVE NOTE Date: 05/04/18 Preoperative Diagnosis:  Ovarian cyst   Postoperative Diagnosis:same (right ovarian cyst, mucinous)    Procedure(s) Performed: Exploratory laparotomy with bilateral salpingo-oophorectomy.  Surgeon: Thereasa Solo, MD.  Assistant Surgeon: Precious Haws, M.D. Assistant: (an MD assistant was necessary for tissue manipulation, retraction and positioning due to the complexity of the case and hospital policies).   Specimens: left and right tube and ovary   Estimated Blood Loss: 50 mL.    Urine VOHYWV:371 cc  Complications: None.   Operative Findings: 20cm complex, mucin filled right ovarian cyst with normal omentum and upper abdomen. Normal left tube and ovary. Surgically absent uterus.     Procedure:   The patient was seen in the Holding Room. The risks, benefits, complications, treatment options, and expected outcomes were discussed with the patient.  The patient concurred with the proposed plan, giving informed consent.   The patient was  identified as Tricia Thomas  and the procedure verified as BSO, possible staging. A Time Out was held and the above information confirmed upon entry to the operating room..  After induction of anesthesia, the patient was draped and prepped in the usual sterile manner.  She was prepped and draped in the normal sterile fashion in the dorsal lithotomy position in padded Allen stirrups with good attention paid to support of the lower back and lower extremities. Position was adjusted for appropriate support. A Foley catheter was placed to gravity.   A right paramedian suprapubic vertical incision was made in the site of a striae (at the patient's request) and carried through the subcutaneous tissue to the fascia. The fascial incision was made and extended superiorally. The rectus muscles were separated. The peritoneum was identified and entered. Peritoneal incision was extended longitudinally.  The abdominal cavity was entered sharply  and without incident. A Bookwalter retractor was then placed. A survey of the abdomen and pelvis revealed the above findings, which were significant for a 20cm right ovarian cyst, all other abdominal findigns were normal.  Washings were obtained from the peritoneal cavity.  An alexis retractor was placed.  The cystic mass was identified and brought to the incision. A gallbladder trochar was used to puncture the cyst and evacuate it of fluid to decompress it. No gross spillage of cyst fluid took place.Allis clamps were used to elevated the puncture site as the cyst was deflated.  The deflated cystic mass was delivered through the abdominal incision.  A window was made in the medial leaf of the broad ligament above the level of the ureter. The right ligamentous attachments to the vaginal cuff and residual round ligament and infundibulopelvic ligament were skeletonized and sealed and transected with scissors and suture. This liberated the ovary from its attachments. 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 Hemostasis was observed at the utero-ovarian and IP ligaments.  The retroperitoneum on the right was opened and the ureter was identified in the medial leaf of the broad ligament deep to the surgical pedicles. The left tube and ovary were grasped with a babcock, and the left retroperitoneum was opened parallel to the IP ligament. The round was transected and the ureter was identifed in the retroperitoneum. A window was made above the ureter and this skeletonized the IP ligament which was clamped, cut and suture ligated. The bovie and clamps and suture were used to separate the ovary from the vaginal attachments.  The peritoneal cavity was irrigated and hemostasis was confirmed  at all surgical sites.  The fascia was reapproximated with 0 looped PDS using a total of two sutures. The subcutaneous layer was then irrigated copiously.   Exparel long acting local anesthetic was infiltrated into the subcutaneous tissues. The skin was closed with subcuticular suture. The patient tolerated the procedure well.   Sponge, lap and needle counts were correct x 2.   Donaciano Eva, MD

## 2018-05-04 NOTE — Progress Notes (Deleted)
Patient given discharge instructions, patient understand instructions. Patient discharged home.

## 2018-05-04 NOTE — Anesthesia Preprocedure Evaluation (Signed)
Anesthesia Evaluation  Patient identified by MRN, date of birth, ID band Patient awake    Reviewed: Allergy & Precautions, NPO status , Patient's Chart, lab work & pertinent test results  History of Anesthesia Complications (+) PONVNegative for: history of anesthetic complications  Airway Mallampati: III  TM Distance: >3 FB Neck ROM: Full    Dental no notable dental hx.    Pulmonary Current Smoker,    Pulmonary exam normal        Cardiovascular negative cardio ROS Normal cardiovascular exam     Neuro/Psych negative neurological ROS  negative psych ROS   GI/Hepatic negative GI ROS, Neg liver ROS,   Endo/Other  negative endocrine ROS  Renal/GU negative Renal ROS  negative genitourinary   Musculoskeletal negative musculoskeletal ROS (+)   Abdominal   Peds  Hematology negative hematology ROS (+)   Anesthesia Other Findings   Reproductive/Obstetrics                             Anesthesia Physical Anesthesia Plan  ASA: II  Anesthesia Plan: General   Post-op Pain Management:    Induction: Intravenous  PONV Risk Score and Plan: 3 and Ondansetron, Dexamethasone, Treatment may vary due to age or medical condition, Scopolamine patch - Pre-op, Midazolam and Propofol infusion  Airway Management Planned: Oral ETT  Additional Equipment: None  Intra-op Plan:   Post-operative Plan: Extubation in OR  Informed Consent: I have reviewed the patients History and Physical, chart, labs and discussed the procedure including the risks, benefits and alternatives for the proposed anesthesia with the patient or authorized representative who has indicated his/her understanding and acceptance.     Plan Discussed with:   Anesthesia Plan Comments:         Anesthesia Quick Evaluation

## 2018-05-04 NOTE — Anesthesia Postprocedure Evaluation (Signed)
Anesthesia Post Note  Patient: Tricia Thomas  Procedure(s) Performed: EXPLORATORY LAPAROTOMY (N/A ) BILATERAL SALPINGO OOPHORECTOMY (Bilateral )     Patient location during evaluation: PACU Anesthesia Type: General Level of consciousness: awake and alert Pain management: pain level controlled Vital Signs Assessment: post-procedure vital signs reviewed and stable Respiratory status: spontaneous breathing, nonlabored ventilation and respiratory function stable Cardiovascular status: blood pressure returned to baseline and stable Postop Assessment: no apparent nausea or vomiting Anesthetic complications: no    Last Vitals:  Vitals:   05/04/18 1545 05/04/18 1600  BP: 100/67 101/70  Pulse: 65 (!) 58  Resp: 19 20  Temp: 36.4 C (!) 36.4 C  SpO2: 98% 100%    Last Pain:  Vitals:   05/04/18 1700  TempSrc:   PainSc: 3                  Lidia Collum

## 2018-05-05 ENCOUNTER — Encounter: Payer: Self-pay | Admitting: Gynecologic Oncology

## 2018-05-05 ENCOUNTER — Encounter (HOSPITAL_COMMUNITY): Payer: Self-pay | Admitting: Gynecologic Oncology

## 2018-05-05 DIAGNOSIS — D270 Benign neoplasm of right ovary: Secondary | ICD-10-CM | POA: Diagnosis not present

## 2018-05-05 LAB — CBC
HEMATOCRIT: 33.7 % — AB (ref 36.0–46.0)
Hemoglobin: 10.8 g/dL — ABNORMAL LOW (ref 12.0–15.0)
MCH: 30.4 pg (ref 26.0–34.0)
MCHC: 32 g/dL (ref 30.0–36.0)
MCV: 94.9 fL (ref 80.0–100.0)
Platelets: 242 10*3/uL (ref 150–400)
RBC: 3.55 MIL/uL — AB (ref 3.87–5.11)
RDW: 13.1 % (ref 11.5–15.5)
WBC: 17.5 10*3/uL — ABNORMAL HIGH (ref 4.0–10.5)
nRBC: 0 % (ref 0.0–0.2)

## 2018-05-05 LAB — BASIC METABOLIC PANEL
ANION GAP: 6 (ref 5–15)
BUN: 8 mg/dL (ref 6–20)
CHLORIDE: 111 mmol/L (ref 98–111)
CO2: 25 mmol/L (ref 22–32)
Calcium: 8.6 mg/dL — ABNORMAL LOW (ref 8.9–10.3)
Creatinine, Ser: 0.89 mg/dL (ref 0.44–1.00)
GFR calc Af Amer: >60 mL/min (ref >60)
GFR calc non Af Amer: >60 mL/min (ref >60)
Glucose, Bld: 131 mg/dL — ABNORMAL HIGH (ref 70–99)
Potassium: 4.8 mmol/L (ref 3.5–5.1)
Sodium: 142 mmol/L (ref 135–145)

## 2018-05-05 MED ORDER — IBUPROFEN 800 MG PO TABS: 800.0000 mg | ORAL_TABLET | Freq: Three times a day (TID) | ORAL | 0 refills | Status: DC | PRN

## 2018-05-05 MED ORDER — SENNOSIDES-DOCUSATE SODIUM 8.6-50 MG PO TABS: 1.0000 | ORAL_TABLET | Freq: Every day | ORAL | 1 refills | Status: DC

## 2018-05-05 MED ORDER — OXYCODONE HCL 5 MG PO TABS: 5.0000 mg | ORAL_TABLET | ORAL | 0 refills | Status: DC | PRN

## 2018-05-05 NOTE — Discharge Summary (Addendum)
Physician Discharge Summary  Patient ID: VAYDA DUNGEE MRN: [id number] DOB/AGE: [birth year] 52 y.o.  Admit date: 05/04/2018 Discharge date: 05/05/2018  Admission Diagnoses: Ovarian cyst  Discharge Diagnoses:  Principal Problem:   Ovarian cyst Active Problems:   Pelvic mass   Discharged Condition:  The patient is in good condition and stable for discharge.   Hospital Course: On 05/04/2018, the patient underwent the following: Procedure(s): [employer].  The postoperative course was uneventful.  She was discharged to home on postoperative day 1 tolerating a regular diet, passing flatus, ambulating, voiding, pain controlled.  Consults: None  Significant Diagnostic Studies: None  Treatments: surgery: see above  Discharge Exam: Blood pressure 96/66, pulse (!) 48, temperature (!) 97.4 F (36.3 C), temperature source Oral, resp. rate 16, height 5\' 4"  (1.626 m), weight 126 lb 15.8 oz (57.6 kg), SpO2 98 %. General appearance: alert, cooperative and no distress Resp: clear to auscultation bilaterally Cardio: bradycardic at times but asymptomatic GI: soft, non-tender; bowel sounds normal; no masses,  no organomegaly Extremities: extremities normal, atraumatic, no cyanosis or edema Incision/Wound: Midline mini-lap incision with op site dressing in place with no active drainage or bleeding noted  Disposition: Discharge disposition: 01-Home or Self Care       Discharge Instructions    Call MD for:  difficulty breathing, headache or visual disturbances   Complete by:  As directed    Call MD for:  extreme fatigue   Complete by:  As directed    Call MD for:  hives   Complete by:  As directed    Call MD for:  persistant dizziness or light-headedness   Complete by:  As directed    Call MD for:  persistant nausea and vomiting   Complete by:  As directed    Call MD for:  redness, tenderness, or signs of infection (pain, swelling, redness,  odor or green/yellow discharge around incision site)   Complete by:  As directed    Call MD for:  severe uncontrolled pain   Complete by:  As directed    Call MD for:  temperature >100.4   Complete by:  As directed    Diet - low sodium heart healthy   Complete by:  As directed    Driving Restrictions   Complete by:  As directed    No driving for 1 week.  Do not take narcotics and drive.   Increase activity slowly   Complete by:  As directed    Lifting restrictions   Complete by:  As directed    No lifting greater than 10 lbs.   Sexual Activity Restrictions   Complete by:  As directed    No sexual activity, nothing in the vagina, for 2 weeks.     Allergies as of 05/05/2018   No Known Allergies     Medication List    STOP taking these medications   azithromycin 250 MG tablet Commonly known as:  ZITHROMAX     TAKE these medications   calcium-vitamin D 500-200 MG-UNIT tablet Commonly known as:  OSCAL WITH D Take 1 tablet by mouth 2 (two) times daily.   ibuprofen 800 MG tablet Commonly known as:  ADVIL,MOTRIN Take 1 tablet (800 mg total) by mouth every 8 (eight) hours as needed for mild pain or moderate pain.   multivitamin tablet Take 1 tablet by mouth daily.   oxyCODONE 5 MG immediate release tablet Commonly known as:  Oxy IR/ROXICODONE Take 1-2 tablets (5-10 mg total)  by mouth every 4 (four) hours as needed for severe pain.   senna-docusate 8.6-50 MG tablet Commonly known as:  Senokot-S Take 1 tablet by mouth at bedtime.   tamoxifen 20 MG tablet Commonly known as:  NOLVADEX TAKE 1 TABLET(20 MG) BY MOUTH DAILY What changed:    how much to take  how to take this  when to take this   varenicline 0.5 MG X 11 & 1 MG X 42 tablet Commonly known as:  CHANTIX PAK Take one 0.5 mg tablet by mouth once daily for 3 days, then increase to one 0.5 mg tablet twice daily for 4 days, then increase to one 1 mg tablet twice daily.   vitamin C 100 MG tablet Take 100 mg by  mouth daily.      Follow-up Information    Everitt Amber, MD Follow up on 05/31/2018.   Specialty:  Gynecologic Oncology Why:  at 11 am at the [facility] information: [address] [place] [address] [phone]           Greater than thirty minutes were spend for face to face discharge instructions and discharge orders/summary in EPIC.   Signed: Dorothyann Gibbs 05/05/2018, 8:40 AM  Patient seen and examined. Agree with above plan of care.

## 2018-05-05 NOTE — Progress Notes (Signed)
Pt alert, oriented, voiding, and tolerating diet.  D/C instructions given, all questions answered.  Pt was d/cd home with spouse.

## 2018-05-05 NOTE — Discharge Instructions (Signed)
05/04/2018  Return to work: 4 weeks  Activity: 1. Be up and out of the bed during the day.  Take a nap if needed.  You may walk up steps but be careful and use the hand rail.  Stair climbing will tire you more than you think, you may need to stop part way and rest.   2. No lifting or straining for 6 weeks.  3. No driving for 1 weeks.  Do Not drive if you are taking narcotic pain medicine.  4. Shower daily.  Use soap and water on your incision and pat dry; don't rub.   5. No sexual activity and nothing in the vagina for 2 weeks.  Medications:  - Take ibuprofen and tylenol first line for pain control. Take these regularly (every 6 hours) to decrease the build up of pain.  - If necessary, for severe pain not relieved by ibuprofen, take oxycodone.  - While taking oxycodone you should take sennakot every night to reduce the likelihood of constipation. If this causes diarrhea, stop its use.  Diet: 1. Low sodium Heart Healthy Diet is recommended.  2. It is safe to use a laxative if you have difficulty moving your bowels.   Wound Care: 1. Keep clean and dry.  Shower daily.  2. You can get the dressing wet in the shower, but avoid tub baths.  3. Remove the dressing between 5 and 7 days after your surgery (1 week after your operation).  4. After the dressing is removed you can get the incision wet in the shower, however continue to avoid tub baths until advised otherwise by your surgeon at follow-up.   Reasons to call the Doctor:   Fever - Oral temperature greater than 100.4 degrees Fahrenheit  Foul-smelling vaginal discharge  Difficulty urinating  Nausea and vomiting  Increased pain at the site of the incision that is unrelieved with pain medicine.  Difficulty breathing with or without chest pain  New calf pain especially if only on one side  Sudden, continuing increased vaginal bleeding with or without clots.   Follow-up: 1. See Everitt Amber in 3-4  weeks.  Contacts: For questions or concerns you should contact:  Dr. Everitt Amber at [phone] After hours and on week-ends call [phone] and ask to speak to the physician on call for Gynecologic Oncology  Oxycodone tablets or capsules What is this medicine? OXYCODONE (ox i KOE done) is a pain reliever. It is used to treat moderate to severe pain. This medicine may be used for other purposes; ask your health care provider or pharmacist if you have questions. COMMON BRAND NAME(S): Dazidox, Endocodone, Oxaydo, OXECTA, OxyIR, Percolone, Roxicodone, ROXYBOND What should I tell my health care provider before I take this medicine? They need to know if you have any of these conditions: -Addison's disease -brain tumor -head injury -heart disease -history of drug or alcohol abuse problem -if you often drink alcohol -kidney disease -liver disease -lung or breathing disease, like asthma -mental illness -pancreatic disease -seizures -thyroid disease -an unusual or allergic reaction to oxycodone, codeine, hydrocodone, morphine, other medicines, foods, dyes, or preservatives -pregnant or trying to get pregnant -breast-feeding How should I use this medicine? Take this medicine by mouth with a glass of water. Follow the directions on the prescription label. You can take it with or without food. If it upsets your stomach, take it with food. Take your medicine at regular intervals. Do not take it more often than directed. Do not stop taking  except on your doctor's advice. Some brands of this medicine, like Oxecta, have special instructions. Ask your doctor or pharmacist if these directions are for you: Do not cut, crush or chew this medicine. Swallow only one tablet at a time. Do not wet, soak, or lick the tablet before you take it. A special MedGuide will be given to you by the pharmacist with each prescription and refill. Be sure to read this information carefully each time. Talk to your  pediatrician regarding the use of this medicine in children. Special care may be needed. Overdosage: If you think you have taken too much of this medicine contact a poison control center or emergency room at once. NOTE: This medicine is only for you. Do not share this medicine with others. What if I miss a dose? If you miss a dose, take it as soon as you can. If it is almost time for your next dose, take only that dose. Do not take double or extra doses. What may interact with this medicine? This medicine may interact with the following medications: -alcohol -antihistamines for allergy, cough and cold -antiviral medicines for HIV or AIDS -atropine -certain antibiotics like clarithromycin, erythromycin, linezolid, rifampin -certain medicines for anxiety or sleep -certain medicines for bladder problems like oxybutynin, tolterodine -certain medicines for depression like amitriptyline, fluoxetine, sertraline -certain medicines for fungal infections like ketoconazole, itraconazole, voriconazole -certain medicines for migraine headache like almotriptan, eletriptan, frovatriptan, naratriptan, rizatriptan, sumatriptan, zolmitriptan -certain medicines for nausea or vomiting like dolasetron, ondansetron, palonosetron -certain medicines for Parkinson's disease like benztropine, trihexyphenidyl -certain medicines for seizures like phenobarbital, phenytoin, primidone -certain medicines for stomach problems like dicyclomine, hyoscyamine -certain medicines for travel sickness like scopolamine -diuretics -general anesthetics like halothane, isoflurane, methoxyflurane, propofol -ipratropium -local anesthetics like lidocaine, pramoxine, tetracaine -MAOIs like Carbex, Eldepryl, Marplan, Nardil, and Parnate -medicines that relax muscles for surgery -methylene blue -nilotinib -other narcotic medicines for pain or cough -phenothiazines like chlorpromazine, mesoridazine, prochlorperazine, thioridazine This  list may not describe all possible interactions. Give your health care provider a list of all the medicines, herbs, non-prescription drugs, or dietary supplements you use. Also tell them if you smoke, drink alcohol, or use illegal drugs. Some items may interact with your medicine. What should I watch for while using this medicine? Tell your doctor or health care professional if your pain does not go away, if it gets worse, or if you have new or a different type of pain. You may develop tolerance to the medicine. Tolerance means that you will need a higher dose of the medicine for pain relief. Tolerance is normal and is expected if you take this medicine for a long time. Do not suddenly stop taking your medicine because you may develop a severe reaction. Your body becomes used to the medicine. This does NOT mean you are addicted. Addiction is a behavior related to getting and using a drug for a non-medical reason. If you have pain, you have a medical reason to take pain medicine. Your doctor will tell you how much medicine to take. If your doctor wants you to stop the medicine, the dose will be slowly lowered over time to avoid any side effects. There are different types of narcotic medicines (opiates). If you take more than one type at the same time or if you are taking another medicine that also causes drowsiness, you may have more side effects. Give your health care provider a list of all medicines you use. Your doctor will tell you how much  medicine to take. Do not take more medicine than directed. Call emergency for help if you have problems breathing or unusual sleepiness. You may get drowsy or dizzy. Do not drive, use machinery, or do anything that needs mental alertness until you know how the medicine affects you. Do not stand or sit up quickly, especially if you are an older patient. This reduces the risk of dizzy or fainting spells. Alcohol may interfere with the effect of this medicine. Avoid  alcoholic drinks. This medicine will cause constipation. Try to have a bowel movement at least every 2 to 3 days. If you do not have a bowel movement for 3 days, call your doctor or health care professional. Your mouth may get dry. Chewing sugarless gum or sucking hard candy, and drinking plenty of water may help. Contact your doctor if the problem does not go away or is severe. What side effects may I notice from receiving this medicine? Side effects that you should report to your doctor or health care professional as soon as possible: -allergic reactions like skin rash, itching or hives, swelling of the face, lips, or tongue -breathing problems -confusion -signs and symptoms of low blood pressure like dizziness; feeling faint or lightheaded, falls; unusually weak or tired -trouble passing urine or change in the amount of urine -trouble swallowing Side effects that usually do not require medical attention (report to your doctor or health care professional if they continue or are bothersome): -constipation -dry mouth -nausea, vomiting -tiredness This list may not describe all possible side effects. Call your doctor for medical advice about side effects. You may report side effects to FDA at 1-800-FDA-1088. Where should I keep my medicine? Keep out of the reach of children. This medicine can be abused. Keep your medicine in a safe place to protect it from theft. Do not share this medicine with anyone. Selling or giving away this medicine is dangerous and against the law. Store at room temperature between 15 and 30 degrees C (59 and 86 degrees F). Protect from light. Keep container tightly closed. This medicine may cause accidental overdose and death if it is taken by other adults, children, or pets. Flush any unused medicine down the toilet to reduce the chance of harm. Do not use the medicine after the expiration date. NOTE: This sheet is a summary. It may not cover all possible information. If  you have questions about this medicine, talk to your doctor, pharmacist, or health care provider.  2018 Elsevier/Gold Standard (2015-05-29 16:55:57)

## 2018-05-31 ENCOUNTER — Encounter: Payer: Self-pay | Admitting: Gynecologic Oncology

## 2018-05-31 ENCOUNTER — Inpatient Hospital Stay: Payer: Commercial Managed Care - PPO | Attending: Oncology | Admitting: Gynecologic Oncology

## 2018-05-31 ENCOUNTER — Telehealth: Payer: Self-pay

## 2018-05-31 ENCOUNTER — Other Ambulatory Visit: Payer: Self-pay | Admitting: Adult Health

## 2018-05-31 VITALS — BP 116/82 | HR 77 | Temp 98.4°F | Resp 18 | Ht 64.0 in | Wt 121.0 lb

## 2018-05-31 DIAGNOSIS — K824 Cholesterolosis of gallbladder: Secondary | ICD-10-CM

## 2018-05-31 DIAGNOSIS — Z90722 Acquired absence of ovaries, bilateral: Secondary | ICD-10-CM | POA: Diagnosis not present

## 2018-05-31 DIAGNOSIS — N83201 Unspecified ovarian cyst, right side: Secondary | ICD-10-CM

## 2018-05-31 DIAGNOSIS — D270 Benign neoplasm of right ovary: Secondary | ICD-10-CM | POA: Diagnosis not present

## 2018-05-31 MED ORDER — VARENICLINE TARTRATE 1 MG PO TABS: 1.0000 mg | ORAL_TABLET | Freq: Two times a day (BID) | ORAL | 0 refills | Status: DC

## 2018-05-31 NOTE — Telephone Encounter (Signed)
-----   Message from Gardenia Phlegm, NP sent at 05/31/2018  1:08 PM EST ----- Please call and let patient know I sent this in.   ----- Message ----- From: Juliane Poot, LPN Sent: 82/10/2351  12:16 PM EST To: Gardenia Phlegm, NP  Patient was in GYN clinic earlier.  Per Barbaraann Share, pt says you gave her a starter pack of Chantix and needs to know what is the next step.  Rx date was 04/27/18.    Cecille Rubin

## 2018-05-31 NOTE — Telephone Encounter (Signed)
LVM for pt about new Rx sent in by NP.

## 2018-05-31 NOTE — Progress Notes (Signed)
Follow-up Note: Gyn-Onc  Consult was requested by nurse practitioner Thedore Mins for the evaluation of Tricia Thomas 52 y.o. female  CC:  Chief Complaint  Patient presents with  . pelvic mass    Assessment/Plan:  Tricia Thomas is a 52 y.o. with a history of a right ovarian mass s/p minilap, BSO on 05/04/18. 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  She is doing well postop with no complications and is released from our care.  She had a right sided complex renal cyst identified on preop CT imaging   - Dr Carol Ada notified with recommendation for MRI of the kidney in 6 months.  She had a gall bladder polyp identified on preop CT imaging  - I have ordered RUQ [place] and this will be followed by Dr Carol Ada  HPI: Tricia Thomas is a 52 y.o. 2 para 2 who per the notes has reported 6 months of lower abdomen pelvis distention.  She does verbalize changes in urinary urgency over the last month.  It there is no weight loss no change in appetite no early satiety no nausea vomiting.  A CT of the abdomen and pelvis on April 28, 2018 is notable for a an 8 mm polypoid soft tissue density in the gallbladder consistent with a gallbladder polyp right kidney is noted to have a complex cystic lesion 5.7 x 4.0 with no thickening or nodular septations mural soft tissue densities moderate right hydronephrosis due to the large pelvic mass.  Within the mass is a complex cystic mass in the central pelvis extending to the lower abdomen with measures 18.4 x 10.3 x 12.7 cm this mass contains numerous thin internal septations with contrast enhancement but no thickened septations or solid mural nodules are identified this mass causes compression of the right ureter and moderate right hydronephrosis.  Ca1 25 is not available for review.  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    Interval Hx: On 05/04/18 she underwent ex lap (minilap) with BSO and final pathology revealed benign cystadenofibroma. Since surgery she has done well. Preop CT scan showed a gall bladder polyp (recommended follow-up with [place]) and complex renal cyst (recommended follow-up with MRI in 6 months) .  Review of Systems:  Constitutional  Feels well, reports cough from sinus drip and yellow expectorant Cardiovascular  No chest pain, shortness of breath, or edema  Pulmonary  No cough or wheeze.  Gastro Intestinal  No nausea, vomitting, or diarrhoea. No bright red blood per rectum, no abdominal pain, change in bowel movement, or constipation.  No early satiety no weight loss no change in appetite Genito Urinary  No frequency, which 1 month of urinary urgency, no dysuria, no vaginal bleeding  musculo Skeletal  No myalgia, arthralgia, joint swelling or pain  Neurologic  No weakness, numbness, change in gait,  Psychology  No depression, anxiety, insomnia.    Current Meds:  Outpatient Encounter Medications as of 05/31/2018  Medication Sig  . Ascorbic Acid (VITAMIN C) 100 MG tablet Take 100 mg by mouth daily.  . calcium-vitamin D (OSCAL WITH D) 500-200 MG-UNIT per tablet Take 1 tablet by mouth 2 (two) times daily.   . Multiple Vitamin (MULTIVITAMIN) tablet Take 1 tablet by mouth daily.  Marland Kitchen senna-docusate (SENOKOT-S) 8.6-50 MG tablet Take 1 tablet  by mouth at bedtime.  . tamoxifen (NOLVADEX) 20 MG tablet TAKE 1 TABLET(20 MG) BY MOUTH DAILY (Patient taking differently: Take 20 mg by mouth daily. TAKE 1 TABLET(20 MG) BY MOUTH DAILY)  . varenicline (CHANTIX PAK) 0.5 MG X 11 & 1 MG X 42 tablet Take one 0.5 mg tablet by mouth once daily for 3 days, then increase to one 0.5 mg tablet twice daily for 4 days, then increase to one 1 mg tablet twice daily.  Marland Kitchen ibuprofen (ADVIL,MOTRIN) 800 MG tablet Take 1 tablet (800 mg  total) by mouth every 8 (eight) hours as needed for mild pain or moderate pain. (Patient not taking: Reported on 05/31/2018)  . [DISCONTINUED] oxyCODONE (OXY IR/ROXICODONE) 5 MG immediate release tablet Take 1-2 tablets (5-10 mg total) by mouth every 4 (four) hours as needed for severe pain.   No facility-administered encounter medications on file as of 05/31/2018.     Allergy: No Known Allergies  Social Hx:   Social History   Socioeconomic History  . Marital status: Married    Spouse name: Not on file  . Number of children: Not on file  . Years of education: Not on file  . Highest education level: Not on file  Occupational History  . Not on file  Social Needs  . Financial resource strain: Not on file  . Food insecurity:    Worry: Not on file    Inability: Not on file  . Transportation needs:    Medical: Not on file    Non-medical: Not on file  Tobacco Use  . Smoking status: Former Smoker    Packs/day: 0.50    Years: 10.00    Pack years: 5.00    Types: Cigarettes    Last attempt to quit: 12/02/2014    Years since quitting: 3.4  . Smokeless tobacco: Never Used  . Tobacco comment: 05-03-18 : had quit in 2016 , but reports she started back up 7 months ago   Substance and Sexual Activity  . Alcohol use: No  . Drug use: No  . Sexual activity: Yes    Birth control/protection: Surgical  Lifestyle  . Physical activity:    Days per week: Not on file    Minutes per session: Not on file  . Stress: Not on file  Relationships  . Social connections:    Talks on phone: Not on file    Gets together: Not on file    Attends religious service: Not on file    Active member of club or organization: Not on file    Attends meetings of clubs or organizations: Not on file    Relationship status: Not on file  . Intimate partner violence:    Fear of current or ex partner: Not on file    Emotionally abused: Not on file    Physically abused: Not on file    Forced sexual activity: Not on file   Other Topics Concern  . Not on file  Social History Narrative  . Not on file    Past Surgical Hx:  Past Surgical History:  Procedure Laterality Date  . APPENDECTOMY  1990  . BREAST BIOPSY Bilateral    "I've had several on both sides"  . BREAST RECONSTRUCTION WITH PLACEMENT OF TISSUE EXPANDER AND FLEX HD (ACELLULAR HYDRATED DERMIS) Right 02/06/2015   Procedure:  PLACEMENT OF RIGHT BREAST TISSUE EXPANDER OR POSSIBLE IMPLANT FOR BREAST RECONSTRUCTION;  Surgeon: Crissie Reese, MD;  Location: [facility];  Service: Plastics;  Laterality: Right;  .  LAPAROTOMY N/A 05/04/2018   Procedure: EXPLORATORY LAPAROTOMY;  Surgeon: Everitt Amber, MD;  Location: [facility];  Service: Gynecology;  Laterality: N/A;  . MASTECTOMY COMPLETE / SIMPLE W/ SENTINEL NODE BIOPSY Right 02/06/2015  . NIPPLE SPARING MASTECTOMY/SENTINAL LYMPH NODE BIOPSY/RECONSTRUCTION/PLACEMENT OF TISSUE EXPANDER Right 02/06/2015   Procedure: NIPPLE SPARING MASTECTOMY WITH SENTINAL LYMPH NODE BIOPSY;  Surgeon: Stark Klein, MD;  Location: [facility];  Service: General;  Laterality: Right;  . RECONSTRUCTION BREAST IMMEDIATE / DELAYED W/ TISSUE EXPANDER Right 02/06/2015  . SALPINGOOPHORECTOMY Bilateral 05/04/2018   Procedure: BILATERAL SALPINGO OOPHORECTOMY;  Surgeon: Everitt Amber, MD;  Location: [facility];  Service: Gynecology;  Laterality: Bilateral;  . VAGINAL HYSTERECTOMY  1989  . WISDOM TOOTH EXTRACTION      Past Medical Hx:  Past Medical History:  Diagnosis Date  . Breast cancer ([place])   . Breast cancer of lower-outer quadrant of right female breast ([place]) 11/01/2014  . Constipation    takes Miralax  . Dysrhythmia   . H/O sinus tachycardia 2014   evaluated By Cardiologist, Dr Harrington Challenger  . PONV (postoperative nausea and vomiting)   . Syncope    sudden onset , occur mutiple times in the past but hasbt happeneded in a long time per patient     Past Gynecological History: Gravida 3 para 2-0-1-0 vaginal hysterectomy 1991 for uterine prolapse acute age 41  regular menses condom use for birth controls no history of abnormal Pap test  Family Hx:  Family History  Problem Relation Age of Onset  . Hyperlipidemia Father   . Hypertension Father   . Kidney failure Maternal Grandmother   . Diabetes Maternal Grandfather   . Stroke Maternal Grandfather   . Lung cancer Paternal Grandmother   . Heart attack Paternal Grandfather   . Heart disease Paternal [place] grandfather with a vascular malignancy  Vitals:  Blood pressure 116/82, pulse 77, temperature 98.4 F (36.9 C), temperature source Oral, resp. rate 18, height 5\' 4"  (1.626 m), weight 121 lb (54.9 kg), SpO2 100 %. Body mass index is 20.77 kg/m.   Physical Exam: WD in NAD Neck  Supple NROM, without any enlargements.  Lymph Node Survey No cervical supraclavicular or inguinal adenopathy Cardiovascular  Pulse normal rate, regularity and rhythm.  Lungs  Clear to auscultation bilaterally, without wheezes/crackles/rhonchi. Good air movement.  Skin  No rash/lesions/breakdown  Psychiatry  Alert and oriented appropriate mood affect speech and reasoning. Abdomen  Normoactive bowel sounds, abdomen soft, non-tender. Well healed incision.  Back No CVA tenderness Genito Urinary  deferred rectal  Good tone, no masses no cul de sac nodularity.  Neck mass palpable no rectovaginal septum nodularity Extremities  No bilateral cyanosis, clubbing or edema.   Thereasa Solo, MD, PhD 05/31/2018, 11:50 AM

## 2018-05-31 NOTE — Patient Instructions (Signed)
Dr Denman George has scheduled you to have an ultrasound of your gallbladder to follow-up the polyp seen on CT scan.  Please follow-up with Dr Tamala Julian, your PCP, regarding follow-up of the gallbladder polyp and kidney cyst see on CT scan as these are not conditions treated by Dr Denman George or Dr Skeet Latch. Dr Tamala Julian has been sent a letter regarding this.   You are cleared of all restrictions from your surgery with Dr Denman George.  No follow-up is necessary as there was no cancer, however, if you have questions, please call her office at [phone].

## 2018-06-01 ENCOUNTER — Other Ambulatory Visit: Payer: Self-pay | Admitting: *Deleted

## 2018-06-04 ENCOUNTER — Ambulatory Visit (HOSPITAL_COMMUNITY)
Admission: RE | Admit: 2018-06-04 | Discharge: 2018-06-04 | Disposition: A | Discharge status: Home / Self Care | Payer: Commercial Managed Care - PPO | Source: Ambulatory Visit | Attending: Gynecologic Oncology | Admitting: Gynecologic Oncology

## 2018-06-04 DIAGNOSIS — K7689 Other specified diseases of liver: Secondary | ICD-10-CM | POA: Insufficient documentation

## 2018-06-04 DIAGNOSIS — K824 Cholesterolosis of gallbladder: Secondary | ICD-10-CM | POA: Diagnosis not present

## 2018-06-04 DIAGNOSIS — N2889 Other specified disorders of kidney and ureter: Secondary | ICD-10-CM | POA: Insufficient documentation

## 2018-06-10 ENCOUNTER — Telehealth: Payer: Self-pay | Admitting: Oncology

## 2018-06-10 NOTE — Telephone Encounter (Signed)
Faxed [place] report to patient's PCP, Dr. Carol Ada at [phone] per Joylene John, NP.

## 2018-10-03 ENCOUNTER — Other Ambulatory Visit (HOSPITAL_COMMUNITY): Payer: Self-pay | Admitting: Gynecologic Oncology

## 2018-10-15 ENCOUNTER — Other Ambulatory Visit: Payer: Self-pay | Admitting: Family Medicine

## 2018-10-15 DIAGNOSIS — N281 Cyst of kidney, acquired: Secondary | ICD-10-CM

## 2018-10-21 ENCOUNTER — Other Ambulatory Visit: Payer: Commercial Managed Care - PPO

## 2018-12-15 ENCOUNTER — Telehealth: Payer: Self-pay | Admitting: Oncology

## 2018-12-15 NOTE — Telephone Encounter (Signed)
Left a message for patient asking if she has a follow up scheduled with Dr. Carol Ada. Requested a return call.

## 2019-01-11 NOTE — Telephone Encounter (Signed)
Left another message regarding scheduling a follow up appointment with Dr. Carol Ada.

## 2019-03-04 ENCOUNTER — Other Ambulatory Visit: Payer: Self-pay | Admitting: Gynecologic Oncology

## 2019-04-12 ENCOUNTER — Other Ambulatory Visit: Payer: Self-pay | Admitting: Oncology

## 2019-04-18 ENCOUNTER — Telehealth: Payer: Self-pay | Admitting: Oncology

## 2019-04-18 NOTE — Telephone Encounter (Signed)
[place] 10/7 moved f/u from 10/7 to 10/8 with [place] per GM. Left message for patient. Schedule mailed.

## 2019-05-04 ENCOUNTER — Ambulatory Visit: Payer: Commercial Managed Care - PPO | Admitting: Oncology

## 2019-05-05 ENCOUNTER — Encounter: Payer: Self-pay | Admitting: Adult Health

## 2019-05-05 ENCOUNTER — Inpatient Hospital Stay: Payer: Commercial Managed Care - PPO | Attending: Oncology | Admitting: Adult Health

## 2019-05-05 ENCOUNTER — Other Ambulatory Visit: Payer: Self-pay

## 2019-05-05 VITALS — BP 125/77 | HR 87 | Temp 98.9°F | Resp 18 | Wt 137.7 lb

## 2019-05-05 DIAGNOSIS — C50511 Malignant neoplasm of lower-outer quadrant of right female breast: Secondary | ICD-10-CM

## 2019-05-05 DIAGNOSIS — N289 Disorder of kidney and ureter, unspecified: Secondary | ICD-10-CM | POA: Insufficient documentation

## 2019-05-05 DIAGNOSIS — Z170 Estrogen receptor positive status [ER+]: Secondary | ICD-10-CM | POA: Diagnosis not present

## 2019-05-05 DIAGNOSIS — Z720 Tobacco use: Secondary | ICD-10-CM | POA: Diagnosis not present

## 2019-05-05 DIAGNOSIS — Z9011 Acquired absence of right breast and nipple: Secondary | ICD-10-CM | POA: Insufficient documentation

## 2019-05-05 DIAGNOSIS — D0511 Intraductal carcinoma in situ of right breast: Secondary | ICD-10-CM | POA: Insufficient documentation

## 2019-05-05 NOTE — Progress Notes (Signed)
CLINIC:  Survivorship   REASON FOR VISIT:  Routine follow-up for history of breast cancer.   BRIEF ONCOLOGIC HISTORY:  Oncology History  Malignant neoplasm of lower-outer quadrant of right breast of female, estrogen receptor positive ([place])  09/03/2013 Mammogram   Left breast: grouped, heterogenous calcifications at posterior depth medial region. No findings in right breast.   09/08/2013 Breast US   Left breast: grouped, scattered punctate round calcifications in the left breast, posterior depth, medial region.  No other masses appreciated.  Likely benign. Recommend 6 month follow up.    03/20/2014 Breast US   Left breast: benign appearing scattered calcifications noted in left breast, appear unchanged.  Recommend 6 month follow up at time of bilateral mammogram.   10/24/2014 Breast US   Right breast: 2.2 cm segmental heterogenous calcifications at 8:00, middle depth. Recommend biopsy.  Stable grouped scattered punctate round calcifications in the left breast, posterior depth, medial region.   10/26/2014 Initial Biopsy   [facility] breast needle core biopsy: DCIS with calcifications, low grade, ER+ (100%), PR+ (100%).   11/10/2014 Breast MRI   Right breast: Suspicious NME extending from the bx cavity anterior lateral margin concerning for associated disease. Nonspecific enhancing mass within the central to slightly superior right breast. Sl. irregular enhancing mass within left breast.   11/24/2014 Procedure   MRI guided biopsies of right upper inner (fibrocystic changes, fibroadenoma) and lower outer (intraductal papilloma with ADH) breast lesions. No malignancy noted.   11/27/2014 Procedure   MRI guided biopsy of left breast: fibrocystic changes, ductal hyperplasia, no evidence of malignancy   11/27/2014 Clinical Stage   Stage 0: Tis N0   02/06/2015 Definitive Surgery   Right mastectomy with implant recons: DCIS with calcifications, low grade, 1.6 cm, scattered ADH, focally positive anterior  margin, 2 right axillary lymph nodes removed, negative (0/2). Soft tissue bx of rt axilla: fibrocystic changes / ductal hyperplasia   02/06/2015 Pathologic Stage   Stage 0: pTis pN0    Radiation Therapy   Not recommended due to concerns about cosmesis   03/16/2015 -  Anti-estrogen oral therapy   Tamoxifen 20 mg daily (Magrinat). Planned duration of therapy: 5 years.   04/10/2015 Survivorship   Survivorship care plan mailed to patient as she was unable to come for in-person visit at this time.      INTERVAL HISTORY:  Ms. Thomas presents to the Survivorship Clinic today for routine follow-up for her history of breast cancer.  Overall, she reports feeling quite well. She is is taking Tamoxifen daily and is tolerating it well.   She denies any issues with taking it.    Tricia continues to smoke.  She says she knows she should quit, but isn't quite ready and hasn't started the chantix we prescribed.  She does not exercise regularly.  She notes she has gained weight this year, of about 16 pounds.  At her last visit, a pelvic mass was palpated and she was sent for CT abdomen and pelvis.  This revealed an 18cm ovarian mass.  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  The CT incidentally showed a right kidney complex cyst and further characterization was recommended to be done in 09/2018 by MRI.  She didn't undergo this MRI due to COVID and it being canceled.  She also had what appeared to be a gall bladder polyp and underwent ultrasound of her abdomen.  The ultrasound revealed gall bladder polyps, with recommendation of repeat in one year, along with growth  in the right kidney complex cystic lesion.  She says she just hasn't followed up on either of these, because she doesn't think there is anything there (she told me she didn't have anything in her abdomen last year, when in fact she had a distended abdomen and 18cm ovarian cystic mass).   REVIEW OF SYSTEMS:  Review of Systems   Constitutional: Positive for unexpected weight change. Negative for appetite change, chills and fatigue.  HENT:   Negative for hearing loss, lump/mass and trouble swallowing.   Eyes: Negative for eye problems and icterus.  Respiratory: Negative for chest tightness and cough.   Cardiovascular: Negative for chest pain, leg swelling and palpitations.  Gastrointestinal: Negative for abdominal distention, abdominal pain, constipation, diarrhea, nausea and vomiting.  Endocrine: Negative for hot flashes.  Genitourinary: Negative for difficulty urinating.   Musculoskeletal: Negative for arthralgias, back pain and flank pain.  Skin: Negative for itching and rash.  Neurological: Negative for dizziness, extremity weakness and headaches.  Hematological: Negative for adenopathy. Does not bruise/bleed easily.  Psychiatric/Behavioral: Negative for depression. The patient is not nervous/anxious.   Breast: Denies any new nodularity, masses, tenderness, nipple changes, or nipple discharge.       PAST MEDICAL/SURGICAL HISTORY:  Past Medical History:  Diagnosis Date  . Breast cancer ([place])   . Breast cancer of lower-outer quadrant of right female breast ([place]) 11/01/2014  . Constipation    takes Miralax  . Dysrhythmia   . H/O sinus tachycardia 2014   evaluated By Cardiologist, Dr Harrington Challenger  . PONV (postoperative nausea and vomiting)   . Syncope    sudden onset , occur mutiple times in the past but hasbt happeneded in a long time per patient    Past Surgical History:  Procedure Laterality Date  . APPENDECTOMY  1990  . BREAST BIOPSY Bilateral    "I've had several on both sides"  . BREAST RECONSTRUCTION WITH PLACEMENT OF TISSUE EXPANDER AND FLEX HD (ACELLULAR HYDRATED DERMIS) Right 02/06/2015   Procedure:  PLACEMENT OF RIGHT BREAST TISSUE EXPANDER OR POSSIBLE IMPLANT FOR BREAST RECONSTRUCTION;  Surgeon: Crissie Reese, MD;  Location: [facility];  Service: Plastics;  Laterality: Right;  . LAPAROTOMY N/A 05/04/2018    Procedure: EXPLORATORY LAPAROTOMY;  Surgeon: Everitt Amber, MD;  Location: [facility];  Service: Gynecology;  Laterality: N/A;  . MASTECTOMY COMPLETE / SIMPLE W/ SENTINEL NODE BIOPSY Right 02/06/2015  . NIPPLE SPARING MASTECTOMY/SENTINAL LYMPH NODE BIOPSY/RECONSTRUCTION/PLACEMENT OF TISSUE EXPANDER Right 02/06/2015   Procedure: NIPPLE SPARING MASTECTOMY WITH SENTINAL LYMPH NODE BIOPSY;  Surgeon: Stark Klein, MD;  Location: [facility];  Service: General;  Laterality: Right;  . RECONSTRUCTION BREAST IMMEDIATE / DELAYED W/ TISSUE EXPANDER Right 02/06/2015  . SALPINGOOPHORECTOMY Bilateral 05/04/2018   Procedure: BILATERAL SALPINGO OOPHORECTOMY;  Surgeon: Everitt Amber, MD;  Location: [facility];  Service: Gynecology;  Laterality: Bilateral;  . VAGINAL HYSTERECTOMY  1989  . WISDOM TOOTH EXTRACTION       ALLERGIES:  No Known Allergies   CURRENT MEDICATIONS:  Outpatient Encounter Medications as of 05/05/2019  Medication Sig  . Ascorbic Acid (VITAMIN C) 100 MG tablet Take 500 mg by mouth 2 (two) times daily.   . calcium-vitamin D (OSCAL WITH D) 500-200 MG-UNIT per tablet Take 1 tablet by mouth 2 (two) times daily.   . Multiple Vitamin (MULTIVITAMIN) tablet Take 1 tablet by mouth daily.  . tamoxifen (NOLVADEX) 20 MG tablet TAKE 1 TABLET(20 MG) BY MOUTH DAILY (Patient taking differently: Take 20 mg by mouth daily. TAKE 1 TABLET(20 MG)  BY MOUTH DAILY)  . [DISCONTINUED] [employer] SENNA PLUS 8.6-50 MG tablet TAKE 1 TABLET BY MOUTH EVERYDAY AT BEDTIME (Patient not taking: Reported on 05/05/2019)  . [DISCONTINUED] ibuprofen (ADVIL,MOTRIN) 800 MG tablet Take 1 tablet (800 mg total) by mouth every 8 (eight) hours as needed for mild pain or moderate pain. (Patient not taking: Reported on 05/31/2018)  . [DISCONTINUED] varenicline (CHANTIX) 1 MG tablet Take 1 tablet (1 mg total) by mouth 2 (two) times daily. (Patient not taking: Reported on 05/05/2019)   No facility-administered encounter medications on file as of 05/05/2019.       ONCOLOGIC FAMILY HISTORY:  Family History  Problem Relation Age of Onset  . Hyperlipidemia Father   . Hypertension Father   . Kidney failure Maternal Grandmother   . Diabetes Maternal Grandfather   . Stroke Maternal Grandfather   . Lung cancer Paternal Grandmother   . Heart attack Paternal Grandfather   . Heart disease Paternal Grandfather     GENETIC COUNSELING/TESTING: Not at this time  SOCIAL HISTORY:  Social History   Socioeconomic History  . Marital status: Married    Spouse name: Not on file  . Number of children: Not on file  . Years of education: Not on file  . Highest education level: Not on file  Occupational History  . Not on file  Social Needs  . Financial resource strain: Not on file  . Food insecurity    Worry: Not on file    Inability: Not on file  . Transportation needs    Medical: Not on file    Non-medical: Not on file  Tobacco Use  . Smoking status: Former Smoker    Packs/day: 0.50    Years: 10.00    Pack years: 5.00    Types: Cigarettes    Quit date: 12/02/2014    Years since quitting: 4.4  . Smokeless tobacco: Never Used  . Tobacco comment: 05-03-18 : had quit in 2016 , but reports she started back up 7 months ago   Substance and Sexual Activity  . Alcohol use: No  . Drug use: No  . Sexual activity: Yes    Birth control/protection: Surgical  Lifestyle  . Physical activity    Days per week: Not on file    Minutes per session: Not on file  . Stress: Not on file  Relationships  . Social Herbalist on phone: Not on file    Gets together: Not on file    Attends religious service: Not on file    Active member of club or organization: Not on file    Attends meetings of clubs or organizations: Not on file    Relationship status: Not on file  . Intimate partner violence    Fear of current or ex partner: Not on file    Emotionally abused: Not on file    Physically abused: Not on file    Forced sexual activity: Not on file   Other Topics Concern  . Not on file  Social History Narrative  . Not on file      PHYSICAL EXAMINATION:  Vital Signs: Vitals:   05/05/19 0909  BP: 125/77  Pulse: 87  Resp: 18  Temp: 98.9 F (37.2 C)  SpO2: 99%   Filed Weights   05/05/19 0909  Weight: 137 lb 11.2 oz (62.5 kg)   General: Well-nourished, well-appearing female in no acute distress.  Unaccompanied today.   HEENT: Head is normocephalic.  Pupils equal and reactive  to light. Conjunctivae clear without exudate.  Sclerae anicteric. Oral mucosa is pink, moist.  Oropharynx is pink without lesions or erythema.  Lymph: No cervical, supraclavicular, or infraclavicular lymphadenopathy noted on palpation.  Cardiovascular: Regular rate and rhythm.Marland Kitchen Respiratory: Clear to auscultation bilaterally. Chest expansion symmetric; breathing non-labored.  Breast Exam:  Right breast s/p mastectomy and reconstruction, no sign of local recurrence, left breast benign -Axilla: No axillary adenopathy bilaterally.  GI: Abdomen soft and round; non-tender, non distended. Bowel sounds normoactive. No hepatosplenomegaly.   GU: Deferred.  Neuro: No focal deficits. Steady gait.  Psych: Mood and affect normal and appropriate for situation.  MSK: No focal spinal tenderness to palpation, full range of motion in bilateral upper extremities Extremities: No edema. Skin: Warm and dry.  LABORATORY DATA:  No visits with results within 1 Day(s) from this visit.  Latest known visit with results is:  Admission on 05/04/2018, Discharged on 05/05/2018  Component Date Value Ref Range Status  . WBC 05/04/2018 9.1  4.0 - 10.5 K/uL Final  . RBC 05/04/2018 4.00  3.87 - 5.11 MIL/uL Final  . Hemoglobin 05/04/2018 12.4  12.0 - 15.0 g/dL Final  . HCT 05/04/2018 38.2  36.0 - 46.0 % Final  . MCV 05/04/2018 95.5  80.0 - 100.0 fL Final  . MCH 05/04/2018 31.0  26.0 - 34.0 pg Final  . MCHC 05/04/2018 32.5  30.0 - 36.0 g/dL Final  . RDW 05/04/2018 13.3  11.5 - 15.5 %  Final  . Platelets 05/04/2018 281  150 - 400 K/uL Final  . nRBC 05/04/2018 0.0  0.0 - 0.2 % Final   Performed at [facility], [place] [address] [place], [place] [address]  . Sodium 05/04/2018 142  135 - 145 mmol/L Final  . Potassium 05/04/2018 3.1* 3.5 - 5.1 mmol/L Final  . Chloride 05/04/2018 106  98 - 111 mmol/L Final  . CO2 05/04/2018 27  22 - 32 mmol/L Final  . Glucose, Bld 05/04/2018 119* 70 - 99 mg/dL Final  . BUN 05/04/2018 10  6 - 20 mg/dL Final  . Creatinine, Ser 05/04/2018 1.01* 0.44 - 1.00 mg/dL Final  . Calcium 05/04/2018 8.6* 8.9 - 10.3 mg/dL Final  . Total Protein 05/04/2018 6.8  6.5 - 8.1 g/dL Final  . Albumin 05/04/2018 4.0  3.5 - 5.0 g/dL Final  . AST 05/04/2018 25  15 - 41 U/L Final  . ALT 05/04/2018 14  0 - 44 U/L Final  . Alkaline Phosphatase 05/04/2018 49  38 - 126 U/L Final  . Total Bilirubin 05/04/2018 0.7  0.3 - 1.2 mg/dL Final  . GFR calc non Af Amer 05/04/2018 >60  >60 mL/min Final  . GFR calc Af Amer 05/04/2018 >60  >60 mL/min Final   Comment: (NOTE) The eGFR has been calculated using the CKD EPI equation. This calculation has not been validated in all clinical situations. eGFR's persistently <60 mL/min signify possible Chronic Kidney Disease.   Georgiann Hahn gap 05/04/2018 9  5 - 15 Final   Performed at [facility], [place] [address], [place], [place] [address]  . Sodium 05/05/2018 142  135 - 145 mmol/L Final  . Potassium 05/05/2018 4.8  3.5 - 5.1 mmol/L Final   Comment: DELTA CHECK NOTED NO VISIBLE HEMOLYSIS   . Chloride 05/05/2018 111  98 - 111 mmol/L Final  . CO2 05/05/2018 25  22 - 32 mmol/L Final  . Glucose, Bld 05/05/2018 131* 70 - 99 mg/dL Final  . BUN 05/05/2018  8  6 - 20 mg/dL Final  . Creatinine, Ser 05/05/2018 0.89  0.44 - 1.00 mg/dL Final  . Calcium 05/05/2018 8.6* 8.9 - 10.3 mg/dL Final  . GFR calc non Af Amer 05/05/2018 >60  >60 mL/min Final  . GFR calc Af Amer 05/05/2018 >60  >60 mL/min Final   Comment:  (NOTE) The eGFR has been calculated using the CKD EPI equation. This calculation has not been validated in all clinical situations. eGFR's persistently <60 mL/min signify possible Chronic Kidney Disease.   Georgiann Hahn gap 05/05/2018 6  5 - 15 Final   Performed at [facility], [place] [address], [place], [place] [address]  . WBC 05/05/2018 17.5* 4.0 - 10.5 K/uL Final  . RBC 05/05/2018 3.55* 3.87 - 5.11 MIL/uL Final  . Hemoglobin 05/05/2018 10.8* 12.0 - 15.0 g/dL Final  . HCT 05/05/2018 33.7* 36.0 - 46.0 % Final  . MCV 05/05/2018 94.9  80.0 - 100.0 fL Final  . MCH 05/05/2018 30.4  26.0 - 34.0 pg Final  . MCHC 05/05/2018 32.0  30.0 - 36.0 g/dL Final  . RDW 05/05/2018 13.1  11.5 - 15.5 % Final  . Platelets 05/05/2018 242  150 - 400 K/uL Final  . nRBC 05/05/2018 0.0  0.0 - 0.2 % Final   Performed at [facility], [place] [address] [place], [place] [address]     DIAGNOSTIC IMAGING:  Most recent mammogram: Done on 03/10/18 at [facility] and was normal, breast density c, awaiting most recent copy    ASSESSMENT AND PLAN:  Ms.. Bennette is a pleasant 53 y.o. female with history of Stage 0 right breast DCIS, ER+/PR+, diagnosed in 09/2014, treated with mastectomy and anti-estrogen therapy with Tamoxifen beginning in 02/2015.  She presents to the Survivorship Clinic for surveillance and routine follow-up.   1. History of breast cancer:  Ms. Pribyl is currently clinically and radiographically without evidence of disease or recurrence of breast cancer. She will be due for mammogram in 2021.  I do not have copy of her most recent mammogram from [facility] and my nurse is requesting this.  She will continue her anti-estrogen therapy with Tamoxifen, with plans to continue for 5 years.  She will see me back in 3 months due to #2 below.  She will return to the cancer center to see her medical oncologist, Dr. Jana Hakim in 1 year.  I encouraged her to call me with any questions or concerns  before her next visit at the cancer center, and I would be happy to see her sooner, if needed.    2. Kidney lesion: I let her know that she needs to have the MRI done and scheduled.  I placed orders for this today and my nurse is pushing the authorization and is going to assist with getting it scheduled in hopes that the patient will go.  I highlighted the importance of her going because if it is something, then it is easier to deal with the smaller it is, and that if she waits until she feels symptoms, it will likely be more difficult to deal with.     3. Tobacco abuse: Smoking cessation counseling provided.   4. Bone health:  Given Ms. Redford's tobacco use, history of breast cancer, she is at risk for bone demineralization. I counseled her that Tamoxifen has a protective effect on the bones.  She was given education on specific food and activities to promote bone health.  5. Cancer screening:  Due to Ms.  Valli's history and her age, she should receive screening for skin cancers, colon cancer.  She was encouraged to follow-up with her PCP for appropriate cancer screenings.   6. Health maintenance and wellness promotion: Ms. Kitzmiller was encouraged to consume 5-7 servings of fruits and vegetables per day. She was also encouraged to engage in moderate to vigorous exercise for 30 minutes per day most days of the week. She was instructed to limit her alcohol consumption and was encouraged stop smoking.      Dispo:  -Return to cancer center in 15month -[facility]   A total of (30) minutes of face-to-face time was spent with this patient with greater than 50% of that time in counseling and care-coordination.   LGardenia Phlegm NP Survivorship Program [phone]  Note: PRIMARY CARE PROVIDER SCarol Ada [phone]

## 2019-05-06 ENCOUNTER — Telehealth: Payer: Self-pay | Admitting: Adult Health

## 2019-05-06 NOTE — Telephone Encounter (Signed)
I talk with patient regarding schedule  

## 2019-05-13 ENCOUNTER — Other Ambulatory Visit: Payer: Self-pay

## 2019-05-13 ENCOUNTER — Ambulatory Visit (HOSPITAL_COMMUNITY)
Admission: RE | Admit: 2019-05-13 | Discharge: 2019-05-13 | Disposition: A | Discharge status: Home / Self Care | Payer: Commercial Managed Care - PPO | Source: Ambulatory Visit | Attending: Adult Health | Admitting: Adult Health

## 2019-05-13 DIAGNOSIS — Z170 Estrogen receptor positive status [ER+]: Secondary | ICD-10-CM

## 2019-05-13 DIAGNOSIS — C50511 Malignant neoplasm of lower-outer quadrant of right female breast: Secondary | ICD-10-CM | POA: Diagnosis not present

## 2019-05-13 MED ORDER — GADOBUTROL 1 MMOL/ML IV SOLN
6.0000 mL | Freq: Once | INTRAVENOUS | Status: AC | PRN
  Administered 2019-05-13: 6 mL via INTRAVENOUS

## 2019-05-17 ENCOUNTER — Other Ambulatory Visit: Payer: Self-pay | Admitting: Adult Health

## 2019-05-17 DIAGNOSIS — N281 Cyst of kidney, acquired: Secondary | ICD-10-CM

## 2019-05-17 NOTE — Progress Notes (Signed)
Called patient and reviewed her MRI results which show a bosniak 30F cyst.  Due to size, and my discussion with radiologist, patient is recommended for repeat MRI in 6 months.  Patient verbalized understanding and appreciation for call.    MRI order placed for 10/2018.  Wilber Bihari, NP

## 2019-07-12 ENCOUNTER — Other Ambulatory Visit: Payer: Self-pay | Admitting: *Deleted

## 2019-07-12 MED ORDER — TAMOXIFEN CITRATE 20 MG PO TABS: ORAL_TABLET | ORAL | 3 refills | Status: DC

## 2019-08-05 ENCOUNTER — Inpatient Hospital Stay: Payer: Commercial Managed Care - PPO | Attending: Oncology | Admitting: Adult Health

## 2019-08-05 ENCOUNTER — Telehealth: Payer: Self-pay | Admitting: Adult Health

## 2019-08-05 NOTE — Telephone Encounter (Signed)
Attempted to call patient to see if she wants to reschedule.  Reached her voicemail.  Unable to leave message.    Wilber Bihari, NP

## 2019-08-31 ENCOUNTER — Other Ambulatory Visit: Payer: Self-pay

## 2019-08-31 ENCOUNTER — Emergency Department (HOSPITAL_COMMUNITY)
Admission: EM | Admit: 2019-08-31 | Discharge: 2019-08-31 | Disposition: A | Discharge status: Home / Self Care | Payer: Commercial Managed Care - PPO | Attending: Emergency Medicine | Admitting: Emergency Medicine

## 2019-08-31 DIAGNOSIS — R1031 Right lower quadrant pain: Secondary | ICD-10-CM | POA: Insufficient documentation

## 2019-08-31 DIAGNOSIS — Z5321 Procedure and treatment not carried out due to patient leaving prior to being seen by health care provider: Secondary | ICD-10-CM | POA: Insufficient documentation

## 2019-08-31 NOTE — ED Notes (Signed)
1st call pt currently not in lab

## 2019-08-31 NOTE — ED Notes (Signed)
Registration reported patient has left, no visualization of patient.

## 2019-08-31 NOTE — ED Triage Notes (Signed)
Patient reports to the ED with c/o right sided flank pain. Patient denies urinary symptoms but reports that her right flank goes around to the abdomen and feels sharp and shooting.

## 2019-09-02 ENCOUNTER — Other Ambulatory Visit: Payer: Self-pay | Admitting: Physician Assistant

## 2019-09-02 ENCOUNTER — Telehealth: Payer: Self-pay | Admitting: Adult Health

## 2019-09-02 ENCOUNTER — Ambulatory Visit
Admission: RE | Admit: 2019-09-02 | Discharge: 2019-09-02 | Disposition: A | Discharge status: Home / Self Care | Payer: Commercial Managed Care - PPO | Source: Ambulatory Visit | Attending: Physician Assistant | Admitting: Physician Assistant

## 2019-09-02 DIAGNOSIS — R109 Unspecified abdominal pain: Secondary | ICD-10-CM

## 2019-09-02 NOTE — Telephone Encounter (Signed)
Returned patient's phone call regarding rescheduling an appointment, left a voicemail. 

## 2020-03-21 DIAGNOSIS — R922 Inconclusive mammogram: Secondary | ICD-10-CM | POA: Diagnosis not present

## 2020-03-21 DIAGNOSIS — Z853 Personal history of malignant neoplasm of breast: Secondary | ICD-10-CM | POA: Diagnosis not present

## 2020-07-04 DIAGNOSIS — Z20822 Contact with and (suspected) exposure to covid-19: Secondary | ICD-10-CM | POA: Diagnosis not present

## 2020-08-08 ENCOUNTER — Other Ambulatory Visit: Payer: Self-pay | Admitting: Oncology

## 2020-08-08 ENCOUNTER — Telehealth: Payer: Self-pay | Admitting: Oncology

## 2020-08-08 NOTE — Telephone Encounter (Signed)
Scheduled follow-up appointment per 1/12 schedule message. Patient is aware. 

## 2020-10-15 ENCOUNTER — Telehealth: Payer: Self-pay | Admitting: Oncology

## 2020-10-15 NOTE — Telephone Encounter (Signed)
Per 3/18 sch msg, patient requested to reschedule 3/22 appt. Moved appt to 3/28. Called and left msg about new date and time

## 2020-10-16 ENCOUNTER — Ambulatory Visit: Payer: Commercial Managed Care - PPO | Admitting: Oncology

## 2020-10-21 NOTE — Progress Notes (Signed)
[place]  [phone] [phone]     ID: Tricia Thomas DOB: [birth year]  MR#: [id number]  UTM#:[id number]  Patient Care Team: Carol Ada, MD as PCP - General (Family Medicine) Stark Klein, MD as Consulting Physician (General Surgery) Magrinat, Virgie Dad, MD as Consulting Physician (Oncology) Eppie Gibson, MD as Attending Physician (Radiation Oncology) Mauro Kaufmann, RN as Registered Nurse Rockwell Germany, RN as Registered Nurse Jake Shark Johny Blamer, NP as Nurse Practitioner (Nurse Practitioner) OTHER MD: Crissie Reese MD  CHIEF COMPLAINT: Ductal carcinoma in situ  CURRENT TREATMENT: Completing 5 years of tamoxifen   INTERVAL HISTORY: Tricia Thomas returns today for 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 I last saw her in 03/2016, and she was most recently seen in our survivorship clinic on 05/05/2019.  She continues on tamoxifen, generally with good tolerance. Hot flashes and vaginal discharge are not major problems. She obtains a drug at no cost at present   REVIEW OF SYSTEMS: Tricia Thomas is doing well.  The only significant news is that her dad she tells me has been diagnosed with colon cancer and is having surgery this week in [place].  She continues to have some hot flashes and vaginal wetness which are doubtless related to her tamoxifen treatment.  She is not exercising regularly and is unfortunately not on her feet much at work.  A detailed review of systems today was otherwise stable   BREAST CANCER HISTORY: From the original intake note:  Tricia Thomas had her first ever mammography at [facility] 09/03/2013. The breast composition was category C. Heterogeneous calcifications were noted in the left breast and she was recalled for additional views 09/08/2013. Grouped and scattered round calcifications in the left breast posteriorly were felt to be most likely benign, but 6 month follow-up was recommended. This was performed 03/20/2014. This time  there was no mammographic evidence of malignancy.  On 10/24/2014 however when the patient had her annual follow-up mammography, in addition to the benign appearing calcifications previously noted, there was a 2.2 cm area of calcifications in the right breast now, upper outer quadrant, which appeared to have increased in number. Biopsy of this area 10/26/2014 showed ([id number]) ductal carcinoma in situ, grade 1, estrogen receptor 100% positive and progesterone receptor 100% positive, both with strong staining intensity.  Her subsequent history is as detailed below   PAST MEDICAL HISTORY: Past Medical History:  Diagnosis Date  . Breast cancer ([place])   . Breast cancer of lower-outer quadrant of right female breast ([place]) 11/01/2014  . Constipation    takes Miralax  . Dysrhythmia   . H/O sinus tachycardia 2014   evaluated By Cardiologist, Dr Harrington Challenger  . PONV (postoperative nausea and vomiting)   . Syncope    sudden onset , occur mutiple times in the past but hasbt happeneded in a long time per patient     PAST SURGICAL HISTORY: Past Surgical History:  Procedure Laterality Date  . APPENDECTOMY  1990  . BREAST BIOPSY Bilateral    "I've had several on both sides"  . BREAST RECONSTRUCTION WITH PLACEMENT OF TISSUE EXPANDER AND FLEX HD (ACELLULAR HYDRATED DERMIS) Right 02/06/2015   Procedure:  PLACEMENT OF RIGHT BREAST TISSUE EXPANDER OR POSSIBLE IMPLANT FOR BREAST RECONSTRUCTION;  Surgeon: Crissie Reese, MD;  Location: [facility];  Service: Plastics;  Laterality: Right;  . LAPAROTOMY N/A 05/04/2018   Procedure: EXPLORATORY LAPAROTOMY;  Surgeon: Everitt Amber, MD;  Location: [facility];  Service: Gynecology;  Laterality: N/A;  . MASTECTOMY COMPLETE /  SIMPLE W/ SENTINEL NODE BIOPSY Right 02/06/2015  . NIPPLE SPARING MASTECTOMY/SENTINAL LYMPH NODE BIOPSY/RECONSTRUCTION/PLACEMENT OF TISSUE EXPANDER Right 02/06/2015   Procedure: NIPPLE SPARING MASTECTOMY WITH SENTINAL LYMPH NODE BIOPSY;  Surgeon: Stark Klein, MD;   Location: [facility];  Service: General;  Laterality: Right;  . RECONSTRUCTION BREAST IMMEDIATE / DELAYED W/ TISSUE EXPANDER Right 02/06/2015  . SALPINGOOPHORECTOMY Bilateral 05/04/2018   Procedure: BILATERAL SALPINGO OOPHORECTOMY;  Surgeon: Everitt Amber, MD;  Location: [facility];  Service: Gynecology;  Laterality: Bilateral;  . VAGINAL HYSTERECTOMY  1989  . WISDOM TOOTH EXTRACTION      FAMILY HISTORY Family History  Problem Relation Age of Onset  . Hyperlipidemia Father   . Hypertension Father   . Kidney failure Maternal Grandmother   . Diabetes Maternal Grandfather   . Stroke Maternal Grandfather   . Lung cancer Paternal Grandmother   . Heart attack Paternal Grandfather   . Heart disease Paternal Grandfather    the patient's parents are still living as of April 2016. The patient has one brother and one sister. There is no cancer in the immediate family. The paternal grandmother died of a "vascular cancer" at age 27. There is no history of breast or ovarian cancer in the family that the patient is aware of   GYNECOLOGIC HISTORY:  No LMP recorded. Patient has had a hysterectomy. Menarche age 51, first live birth age 74. She is GX P2. She underwent a hysterectomy without salpingo-oophorectomy in 1989. She did not take hormone replacement.   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 Her (second) husband, Tricia Thomas is Clinical cytogeneticist store in [place] with the patient lives.(She works in [place]). The patient's children are Tricia Thomas Lives in [place] and Works As a Marine scientist and [employer] Who Lives in [place] and Is a Dealer. The Patient Has 2 Grandchildren. She Is Not a Ambulance person.    ADVANCED DIRECTIVES: Not in place   HEALTH MAINTENANCE: Social History   Tobacco Use  . Smoking status: Former Smoker    Packs/day: 0.50    Years: 10.00    Pack years: 5.00    Types: Cigarettes    Quit date: 12/02/2014    Years  since quitting: 5.8  . Smokeless tobacco: Never Used  . Tobacco comment: 05-03-18 : had quit in 2016 , but reports she started back up 7 months ago   Vaping Use  . Vaping Use: Never used  Substance Use Topics  . Alcohol use: No  . Drug use: No     Colonoscopy:  PAP:  Bone density:  Lipid panel:  No Known Allergies  Current Outpatient Medications  Medication Sig Dispense Refill  . Ascorbic Acid (VITAMIN C) 100 MG tablet Take 500 mg by mouth 2 (two) times daily.     . calcium-vitamin D (OSCAL WITH D) 500-200 MG-UNIT per tablet Take 1 tablet by mouth 2 (two) times daily.     . Multiple Vitamin (MULTIVITAMIN) tablet Take 1 tablet by mouth daily.    . tamoxifen (NOLVADEX) 20 MG tablet TAKE 1 TABLET BY MOUTH EVERY DAY 90 tablet 0   No current facility-administered medications for this visit.    OBJECTIVE: Middle-aged white woman in no acute distress Vitals:   10/22/20 1501  BP: 126/82  Pulse: 94  Resp: 17  Temp: 98.2 F (36.8 C)  SpO2: 98%     Body mass index is 22.9 kg/m.    ECOG FS:1 -  Symptomatic but completely ambulatory  Sclerae unicteric, EOMs intact Wearing a mask No cervical or supraclavicular adenopathy Lungs no rales or rhonchi Heart regular rate and rhythm Abd soft, nontender, positive bowel sounds MSK no focal spinal tenderness, no upper extremity lymphedema Neuro: nonfocal, well oriented, appropriate affect Breasts: The right breast is status post mastectomy and reconstruction, with no evidence of disease recurrence.  The left breast and both axillae are benign.   LAB RESULTS:  CMP     Component Value Date/Time   NA 142 05/05/2018 0429   NA 143 04/24/2016 1129   K 4.8 05/05/2018 0429   K 4.4 04/24/2016 1129   CL 111 05/05/2018 0429   CO2 25 05/05/2018 0429   CO2 24 04/24/2016 1129   GLUCOSE 131 (H) 05/05/2018 0429   GLUCOSE 84 04/24/2016 1129   BUN 8 05/05/2018 0429   BUN 6.1 (L) 04/24/2016 1129   CREATININE 0.89 05/05/2018 0429   CREATININE  1.02 (H) 04/27/2018 0843   CREATININE 0.9 04/24/2016 1129   CALCIUM 8.6 (L) 05/05/2018 0429   CALCIUM 9.3 04/24/2016 1129   PROT 6.8 05/04/2018 1228   PROT 6.7 04/24/2016 1129   ALBUMIN 4.0 05/04/2018 1228   ALBUMIN 3.8 04/24/2016 1129   AST 25 05/04/2018 1228   AST 17 04/27/2018 0843   AST 15 04/24/2016 1129   ALT 14 05/04/2018 1228   ALT 12 04/27/2018 0843   ALT 14 04/24/2016 1129   ALKPHOS 49 05/04/2018 1228   ALKPHOS 48 04/24/2016 1129   BILITOT 0.7 05/04/2018 1228   BILITOT 0.4 04/27/2018 0843   BILITOT 0.44 04/24/2016 1129   GFRNONAA >60 05/05/2018 0429   GFRNONAA >60 04/27/2018 0843   GFRAA >60 05/05/2018 0429   GFRAA >60 04/27/2018 0843    INo results found for: SPEP, UPEP  Lab Results  Component Value Date   WBC 17.5 (H) 05/05/2018   NEUTROABS 5.9 04/27/2018   HGB 10.8 (L) 05/05/2018   HCT 33.7 (L) 05/05/2018   MCV 94.9 05/05/2018   PLT 242 05/05/2018      Chemistry      Component Value Date/Time   NA 142 05/05/2018 0429   NA 143 04/24/2016 1129   K 4.8 05/05/2018 0429   K 4.4 04/24/2016 1129   CL 111 05/05/2018 0429   CO2 25 05/05/2018 0429   CO2 24 04/24/2016 1129   BUN 8 05/05/2018 0429   BUN 6.1 (L) 04/24/2016 1129   CREATININE 0.89 05/05/2018 0429   CREATININE 1.02 (H) 04/27/2018 0843   CREATININE 0.9 04/24/2016 1129      Component Value Date/Time   CALCIUM 8.6 (L) 05/05/2018 0429   CALCIUM 9.3 04/24/2016 1129   ALKPHOS 49 05/04/2018 1228   ALKPHOS 48 04/24/2016 1129   AST 25 05/04/2018 1228   AST 17 04/27/2018 0843   AST 15 04/24/2016 1129   ALT 14 05/04/2018 1228   ALT 12 04/27/2018 0843   ALT 14 04/24/2016 1129   BILITOT 0.7 05/04/2018 1228   BILITOT 0.4 04/27/2018 0843   BILITOT 0.44 04/24/2016 1129       No results found for: LABCA2  No components found for: AJGOT157  No results for input(s): INR in the last 168 hours.  Urinalysis    Component Value Date/Time   COLORURINE STRAW (A) 05/03/2018 1522   APPEARANCEUR CLEAR  05/03/2018 1522   LABSPEC 1.005 05/03/2018 1522   PHURINE 7.0 05/03/2018 1522   GLUCOSEU NEGATIVE 05/03/2018 1522   HGBUR SMALL (A) 05/03/2018 1522  BILIRUBINUR NEGATIVE 05/03/2018 1522   KETONESUR 20 (A) 05/03/2018 1522   PROTEINUR NEGATIVE 05/03/2018 1522   NITRITE NEGATIVE 05/03/2018 1522   LEUKOCYTESUR SMALL (A) 05/03/2018 1522    STUDIES: No results found.   ASSESSMENT: 55 y.o. [place] woman status post right breast lower outer quadrant biopsy 10/26/2014 for ductal carcinoma in situ, grade 1, strongly estrogen and progesterone receptor positive  (1) status post right mastectomy and sentinel lymph node sampling 02/06/2015 for ductal carcinoma in situ, low-grade, measuring 1.6 cm, with a focally positive anterior margin; both sentinel lymph nodes were clear  (a) margin cannot be cleared surgically (unknown location)  (2) patient discussed adjuvant radiation for margin and assurance but was advised against it because of concerns regarding cosmesis post reconstruction  (3) tamoxifen started 03/16/2015, discontinued March 2022  (4) continuing tobacco abuse: On Chantix   PLAN: Tricia Thomas is now 5-1/2 years out from definitive surgery for her breast cancer with no evidence of disease recurrence.  This is very favorable.  She has completed a little more than 5 years of tamoxifen.  She has a couple of months left and she would prefer to go ahead and take those but then she can stop this medication.  I think her hot flashes and vaginal wetness will improve significantly within 2 months of discontinuing tamoxifen.  At this point I feel comfortable releasing her to her primary care physician.  All she will need in terms of breast cancer follow-up is her yearly left mammography and a yearly physician breast exam  I will be glad to see Tricia Thomas again in a point in the future if and when the need arises but as of now are making no further routine appointments for her here.  Total  encounter time 20 minutes.Chauncey Cruel, MD   10/22/2020 5:23 PM  Oncology and Hematology [facility] [place], [place] [address] Tel. [phone]  Tricia Thomas [phone]   I, Wilburn Mylar, am acting as scribe for Dr. Sarajane Jews C. Magrinat.  I, Lurline Del MD, have reviewed the above documentation for accuracy and completeness, and I agree with the above.     *Total Encounter Time as defined by the Centers for Medicare and Medicaid Services includes, in addition to the face-to-face time of a patient visit (documented in the note above) non-face-to-face time: obtaining and reviewing outside history, ordering and reviewing medications, tests or procedures, care coordination (communications with other health care professionals or caregivers) and documentation in the medical record.

## 2020-10-22 ENCOUNTER — Other Ambulatory Visit: Payer: Self-pay

## 2020-10-22 ENCOUNTER — Inpatient Hospital Stay: Payer: BC Managed Care – PPO | Attending: Oncology | Admitting: Oncology

## 2020-10-22 VITALS — BP 126/82 | HR 94 | Temp 98.2°F | Resp 17 | Ht 65.0 in | Wt 137.6 lb

## 2020-10-22 DIAGNOSIS — Z9011 Acquired absence of right breast and nipple: Secondary | ICD-10-CM | POA: Insufficient documentation

## 2020-10-22 DIAGNOSIS — C50511 Malignant neoplasm of lower-outer quadrant of right female breast: Secondary | ICD-10-CM | POA: Diagnosis not present

## 2020-10-22 DIAGNOSIS — Z720 Tobacco use: Secondary | ICD-10-CM | POA: Diagnosis not present

## 2020-10-22 DIAGNOSIS — Z170 Estrogen receptor positive status [ER+]: Secondary | ICD-10-CM | POA: Diagnosis not present

## 2020-10-22 DIAGNOSIS — D0511 Intraductal carcinoma in situ of right breast: Secondary | ICD-10-CM | POA: Insufficient documentation

## 2020-10-22 DIAGNOSIS — N898 Other specified noninflammatory disorders of vagina: Secondary | ICD-10-CM | POA: Diagnosis not present

## 2020-10-22 DIAGNOSIS — N951 Menopausal and female climacteric states: Secondary | ICD-10-CM | POA: Diagnosis not present

## 2020-11-05 ENCOUNTER — Other Ambulatory Visit: Payer: Self-pay | Admitting: Oncology

## 2020-11-06 DIAGNOSIS — E7800 Pure hypercholesterolemia, unspecified: Secondary | ICD-10-CM | POA: Diagnosis not present

## 2020-11-06 DIAGNOSIS — R300 Dysuria: Secondary | ICD-10-CM | POA: Diagnosis not present

## 2020-11-06 DIAGNOSIS — Z0000 Encounter for general adult medical examination without abnormal findings: Secondary | ICD-10-CM | POA: Diagnosis not present

## 2020-11-21 ENCOUNTER — Encounter: Payer: Self-pay | Admitting: Oncology

## 2021-03-22 DIAGNOSIS — Z1231 Encounter for screening mammogram for malignant neoplasm of breast: Secondary | ICD-10-CM | POA: Diagnosis not present

## 2022-10-23 DIAGNOSIS — Z1231 Encounter for screening mammogram for malignant neoplasm of breast: Secondary | ICD-10-CM | POA: Diagnosis not present

## 2022-11-26 DIAGNOSIS — S0501XA Injury of conjunctiva and corneal abrasion without foreign body, right eye, initial encounter: Secondary | ICD-10-CM | POA: Diagnosis not present
